# Patient Record
Sex: Female | Born: 1980 | Race: White | Hispanic: No | Marital: Single | Smoking: Former smoker
Health system: Southern US, Community
[De-identification: ages and names within clinical notes are randomized; demographics above are authoritative.]

## PROBLEM LIST (undated history)

## (undated) DIAGNOSIS — R002 Palpitations: Secondary | ICD-10-CM

## (undated) DIAGNOSIS — I10 Essential (primary) hypertension: Secondary | ICD-10-CM

## (undated) HISTORY — DX: Palpitations: R00.2

## (undated) HISTORY — DX: Essential (primary) hypertension: I10

## (undated) HISTORY — PX: ABDOMINAL HYSTERECTOMY: SHX81

---

## 2004-12-09 ENCOUNTER — Emergency Department: Payer: Self-pay | Admitting: General Practice

## 2005-09-07 ENCOUNTER — Emergency Department: Payer: Self-pay | Admitting: Emergency Medicine

## 2006-05-05 ENCOUNTER — Emergency Department: Payer: Self-pay | Admitting: Emergency Medicine

## 2007-07-04 ENCOUNTER — Emergency Department: Payer: Self-pay | Admitting: Internal Medicine

## 2008-12-19 ENCOUNTER — Emergency Department: Payer: Self-pay | Admitting: Emergency Medicine

## 2009-07-28 ENCOUNTER — Emergency Department: Payer: Self-pay | Admitting: Emergency Medicine

## 2010-04-22 ENCOUNTER — Ambulatory Visit: Payer: Self-pay | Admitting: Unknown Physician Specialty

## 2010-05-09 ENCOUNTER — Emergency Department: Payer: Self-pay

## 2012-05-04 ENCOUNTER — Emergency Department: Payer: Self-pay | Admitting: Unknown Physician Specialty

## 2012-05-04 LAB — COMPREHENSIVE METABOLIC PANEL
Albumin: 3.7 g/dL (ref 3.4–5.0)
Co2: 30 mmol/L (ref 21–32)
Creatinine: 0.75 mg/dL (ref 0.60–1.30)
EGFR (African American): >60
EGFR (Non-African Amer.): >60
Osmolality: 277 (ref 275–301)
Potassium: 4.1 mmol/L (ref 3.5–5.1)
SGPT (ALT): 26 U/L
Sodium: 139 mmol/L (ref 136–145)

## 2012-05-04 LAB — URINALYSIS, COMPLETE
Glucose,UR: NEGATIVE mg/dL (ref 0–75)
Leukocyte Esterase: NEGATIVE
Nitrite: NEGATIVE
Protein: NEGATIVE
RBC,UR: <1 /HPF (ref 0–5)
Specific Gravity: 1.013 (ref 1.003–1.030)
WBC UR: 1 /HPF (ref 0–5)

## 2012-05-04 LAB — CK TOTAL AND CKMB (NOT AT ARMC)
CK, Total: 92 U/L (ref 21–215)
CK-MB: <0.5 ng/mL — ABNORMAL LOW (ref 0.5–3.6)

## 2012-05-04 LAB — CBC
HCT: 43.5 % (ref 35.0–47.0)
MCHC: 33.8 g/dL (ref 32.0–36.0)
Platelet: 340 10*3/uL (ref 150–440)
RBC: 5.09 10*6/uL (ref 3.80–5.20)
WBC: 11.3 10*3/uL — ABNORMAL HIGH (ref 3.6–11.0)

## 2013-09-06 ENCOUNTER — Emergency Department: Payer: Self-pay | Admitting: Emergency Medicine

## 2016-01-16 ENCOUNTER — Emergency Department: Payer: BLUE CROSS/BLUE SHIELD

## 2016-01-16 ENCOUNTER — Emergency Department
Admission: EM | Admit: 2016-01-16 | Discharge: 2016-01-16 | Disposition: A | Discharge status: Home / Self Care | Payer: BLUE CROSS/BLUE SHIELD | Attending: Emergency Medicine | Admitting: Emergency Medicine

## 2016-01-16 ENCOUNTER — Encounter: Payer: Self-pay | Admitting: Emergency Medicine

## 2016-01-16 DIAGNOSIS — R002 Palpitations: Secondary | ICD-10-CM | POA: Diagnosis not present

## 2016-01-16 DIAGNOSIS — I10 Essential (primary) hypertension: Secondary | ICD-10-CM | POA: Diagnosis present

## 2016-01-16 DIAGNOSIS — F17200 Nicotine dependence, unspecified, uncomplicated: Secondary | ICD-10-CM | POA: Diagnosis not present

## 2016-01-16 LAB — TROPONIN I: Troponin I: <0.03 ng/mL (ref <0.031)

## 2016-01-16 LAB — CBC
HEMATOCRIT: 41.6 % (ref 35.0–47.0)
Hemoglobin: 14.3 g/dL (ref 12.0–16.0)
MCH: 29.1 pg (ref 26.0–34.0)
MCHC: 34.4 g/dL (ref 32.0–36.0)
MCV: 84.7 fL (ref 80.0–100.0)
Platelets: 311 10*3/uL (ref 150–440)
RBC: 4.91 MIL/uL (ref 3.80–5.20)
RDW: 14.3 % (ref 11.5–14.5)
WBC: 10 10*3/uL (ref 3.6–11.0)

## 2016-01-16 LAB — BASIC METABOLIC PANEL
Anion gap: 8 (ref 5–15)
BUN: 14 mg/dL (ref 6–20)
CHLORIDE: 104 mmol/L (ref 101–111)
CO2: 25 mmol/L (ref 22–32)
CREATININE: 0.76 mg/dL (ref 0.44–1.00)
Calcium: 9.7 mg/dL (ref 8.9–10.3)
GFR calc Af Amer: >60 mL/min (ref >60)
GFR calc non Af Amer: >60 mL/min (ref >60)
GLUCOSE: 91 mg/dL (ref 65–99)
POTASSIUM: 4.1 mmol/L (ref 3.5–5.1)
SODIUM: 137 mmol/L (ref 135–145)

## 2016-01-16 LAB — TSH: TSH: 1.22 u[IU]/mL (ref 0.350–4.500)

## 2016-01-16 MED ORDER — HYDROCHLOROTHIAZIDE 25 MG PO TABS: 25.0000 mg | ORAL_TABLET | Freq: Every day | ORAL | Status: DC

## 2016-01-16 NOTE — ED Provider Notes (Signed)
[facility] Emergency Department Provider Note     Time seen: ----------------------------------------- 1:31 PM on 01/16/2016 -----------------------------------------    I have reviewed the triage vital signs and the nursing notes.   HISTORY  Chief Complaint Tachycardia    HPI Yvonne Davis is a 35 y.o. female who presents ER stating her heart is fluttering for a long time. Patient states today it is worse as making her feel bad, states she doesn't have a doctor so she presents here for further evaluation. Patient states that she has been off her blood pressure medicines for about a year because she couldn't afford it and she has no insurance. She denies fevers, chills, chest pain, shortness of breath, nausea vomiting or diarrhea. She does have a history of hypertension.She has noted that caffeine worsens or symptoms.   History reviewed. No pertinent past medical history.  There are no active problems to display for this patient.   No past surgical history on file.  Allergies Review of patient's allergies indicates no known allergies.  Social History Social History  Substance Use Topics  . Smoking status: Current Every Day Smoker  . Smokeless tobacco: None  . Alcohol Use: No    Review of Systems Constitutional: Negative for fever. Eyes: Negative for visual changes. ENT: Negative for sore throat. Cardiovascular: Negative for chest pain. Positive for palpitations Respiratory: Negative for shortness of breath. Gastrointestinal: Negative for abdominal pain, vomiting and diarrhea. Genitourinary: Negative for dysuria. Musculoskeletal: Negative for back pain. Skin: Negative for rash. Neurological: Negative for headaches, focal weakness or numbness.  10-point ROS otherwise negative.  ____________________________________________   PHYSICAL EXAM:  VITAL SIGNS: ED Triage Vitals  Enc Vitals Group     BP 01/16/16 1255 171/98 mmHg     Pulse  Rate 01/16/16 1255 78     Resp 01/16/16 1255 16     Temp 01/16/16 1255 98.2 F (36.8 C)     Temp Source 01/16/16 1255 Oral     SpO2 01/16/16 1255 96 %     Weight 01/16/16 1255 200 lb (90.719 kg)     Height 01/16/16 1255  (1.702 m)     Head Cir --      Peak Flow --      Pain Score --      Pain Loc --      Pain Edu? --      Excl. in [place]? --     Constitutional: Alert and oriented. Well appearing and in no distress. Eyes: Conjunctivae are normal. PERRL. Normal extraocular movements. ENT   Head: Normocephalic and atraumatic.   Nose: No congestion/rhinnorhea.   Mouth/Throat: Mucous membranes are moist.   Neck: No stridor. Cardiovascular: Normal rate, regular rhythm. Normal and symmetric distal pulses are present in all extremities. No murmurs, rubs, or gallops. Respiratory: Normal respiratory effort without tachypnea nor retractions. Breath sounds are clear and equal bilaterally. No wheezes/rales/rhonchi. Gastrointestinal: Soft and nontender. No distention. No abdominal bruits.  Musculoskeletal: Nontender with normal range of motion in all extremities. No joint effusions.  No lower extremity tenderness nor edema. Neurologic:  Normal speech and language. No gross focal neurologic deficits are appreciated. Speech is normal. No gait instability. Skin:  Skin is warm, dry and intact. No rash noted. Psychiatric: Mood and affect are normal. Speech and behavior are normal. Patient exhibits appropriate insight and judgment. ____________________________________________  EKG: Interpreted by me. Normal sinus rhythm with a rate of 79 bpm, normal PR interval, normal QRS, normal QT interval, PVCs, normal  axis.  ____________________________________________  ED COURSE:  Pertinent labs & imaging results that were available during my care of the patient were reviewed by me and considered in my medical decision making (see chart for details). Patient is in no acute distress but does have  significant hypertension, I will check basic labs and reevaluate. ____________________________________________    LABS (pertinent positives/negatives)  Labs Reviewed  BASIC METABOLIC PANEL  CBC  TROPONIN I  TSH   ____________________________________________  FINAL ASSESSMENT AND PLAN  Palpitations, hypertension  Plan: Patient with labs and imaging as dictated above. Patient be restarted on HCTZ for her blood pressure. Palpitations are likely multifactorial, will advise stopping all caffeine intake. She stable for outpatient follow-up.   Emily Filbert, MD   Emily Filbert, MD 01/16/16 [phone]

## 2016-01-16 NOTE — ED Notes (Signed)
Lab called about add on TSH. States they will run it.

## 2016-01-16 NOTE — ED Notes (Signed)
Says her heart has been fluttering for a long time, but today it is worse and it is making her feel bad.  Says she does not have a doctor.

## 2016-01-16 NOTE — Discharge Instructions (Signed)
Hypertension °Hypertension, commonly called high blood pressure, is when the force of blood pumping through your arteries is too strong. Your arteries are the blood vessels that carry blood from your heart throughout your body. A blood pressure reading consists of a higher number over a lower number, such as 110/72. The higher number (systolic) is the pressure inside your arteries when your heart pumps. The lower number (diastolic) is the pressure inside your arteries when your heart relaxes. Ideally you want your blood pressure below 120/80. °Hypertension forces your heart to work harder to pump blood. Your arteries may become narrow or stiff. Having untreated or uncontrolled hypertension can cause heart attack, stroke, kidney disease, and other problems. °RISK FACTORS °Some risk factors for high blood pressure are controllable. Others are not.  °Risk factors you cannot control include:  °· Race. You may be at higher risk if you are African American. °· Age. Risk increases with age. °· Gender. Men are at higher risk than women before age 45 years. After age 65, women are at higher risk than men. °Risk factors you can control include: °· Not getting enough exercise or physical activity. °· Being overweight. °· Getting too much fat, sugar, calories, or salt in your diet. °· Drinking too much alcohol. °SIGNS AND SYMPTOMS °Hypertension does not usually cause signs or symptoms. Extremely high blood pressure (hypertensive crisis) may cause headache, anxiety, shortness of breath, and nosebleed. °DIAGNOSIS °To check if you have hypertension, your health care provider will measure your blood pressure while you are seated, with your arm held at the level of your heart. It should be measured at least twice using the same arm. Certain conditions can cause a difference in blood pressure between your right and left arms. A blood pressure reading that is higher than normal on one occasion does not mean that you need treatment. If  it is not clear whether you have high blood pressure, you may be asked to return on a different day to have your blood pressure checked again. Or, you may be asked to monitor your blood pressure at home for 1 or more weeks. °TREATMENT °Treating high blood pressure includes making lifestyle changes and possibly taking medicine. Living a healthy lifestyle can help lower high blood pressure. You may need to change some of your habits. °Lifestyle changes may include: °· Following the DASH diet. This diet is high in fruits, vegetables, and whole grains. It is low in salt, red meat, and added sugars. °· Keep your sodium intake below 2,300 mg per day. °· Getting at least 30-45 minutes of aerobic exercise at least 4 times per week. °· Losing weight if necessary. °· Not smoking. °· Limiting alcoholic beverages. °· Learning ways to reduce stress. °Your health care provider may prescribe medicine if lifestyle changes are not enough to get your blood pressure under control, and if one of the following is true: °· You are 18-59 years of age and your systolic blood pressure is above 140. °· You are 60 years of age or older, and your systolic blood pressure is above 150. °· Your diastolic blood pressure is above 90. °· You have diabetes, and your systolic blood pressure is over 140 or your diastolic blood pressure is over 90. °· You have kidney disease and your blood pressure is above 140/90. °· You have heart disease and your blood pressure is above 140/90. °Your personal target blood pressure may vary depending on your medical conditions, your age, and other factors. °HOME CARE INSTRUCTIONS °·   Have your blood pressure rechecked as directed by your health care provider.   °· Take medicines only as directed by your health care provider. Follow the directions carefully. Blood pressure medicines must be taken as prescribed. The medicine does not work as well when you skip doses. Skipping doses also puts you at risk for  problems. °· Do not smoke.   °· Monitor your blood pressure at home as directed by your health care provider.  °SEEK MEDICAL CARE IF:  °· You think you are having a reaction to medicines taken. °· You have recurrent headaches or feel dizzy. °· You have swelling in your ankles. °· You have trouble with your vision. °SEEK IMMEDIATE MEDICAL CARE IF: °· You develop a severe headache or confusion. °· You have unusual weakness, numbness, or feel faint. °· You have severe chest or abdominal pain. °· You vomit repeatedly. °· You have trouble breathing. °MAKE SURE YOU:  °· Understand these instructions. °· Will watch your condition. °· Will get help right away if you are not doing well or get worse. °  °This information is not intended to replace advice given to you by your health care provider. Make sure you discuss any questions you have with your health care provider. °  °Document Released: 12/07/2005 Document Revised: 04/23/2015 Document Reviewed: 09/29/2013 °Elsevier Interactive Patient Education ©2016 [employer] ° °Palpitations °A palpitation is the feeling that your heartbeat is irregular or is faster than normal. It may feel like your heart is fluttering or skipping a beat. Palpitations are usually not a serious problem. However, in some cases, you may need further medical evaluation. °CAUSES  °Palpitations can be caused by: °· Smoking. °· Caffeine or other stimulants, such as diet pills or energy drinks. °· Alcohol. °· Stress and anxiety. °· Strenuous physical activity. °· Fatigue. °· Certain medicines. °· Heart disease, especially if you have a history of irregular heart rhythms (arrhythmias), such as atrial fibrillation, atrial flutter, or supraventricular tachycardia. °· An improperly working pacemaker or defibrillator. °DIAGNOSIS  °To find the cause of your palpitations, your health care provider will take your medical history and perform a physical exam. Your health care provider may also have you take a  test called an ambulatory electrocardiogram (ECG). An ECG records your heartbeat patterns over a 24-hour period. You may also have other tests, such as: °· Transthoracic echocardiogram (TTE). During echocardiography, sound waves are used to evaluate how blood flows through your heart. °· Transesophageal echocardiogram (TEE). °· Cardiac monitoring. This allows your health care provider to monitor your heart rate and rhythm in real time. °· Holter monitor. This is a portable device that records your heartbeat and can help diagnose heart arrhythmias. It allows your health care provider to track your heart activity for several days, if needed. °· Stress tests by exercise or by giving medicine that makes the heart beat faster. °TREATMENT  °Treatment of palpitations depends on the cause of your symptoms and can vary greatly. Most cases of palpitations do not require any treatment other than time, relaxation, and monitoring your symptoms. Other causes, such as atrial fibrillation, atrial flutter, or supraventricular tachycardia, usually require further treatment. °HOME CARE INSTRUCTIONS  °· Avoid: °¨ Caffeinated coffee, tea, soft drinks, diet pills, and energy drinks. °¨ Chocolate. °¨ Alcohol. °· Stop smoking if you smoke. °· Reduce your stress and anxiety. Things that can help you relax include: °¨ A method of controlling things in your body, such as your heartbeats, with your mind (biofeedback). °¨ Yoga. °¨ Meditation. °¨   Physical activity such as swimming, jogging, or walking. °· Get plenty of rest and sleep. °SEEK MEDICAL CARE IF:  °· You continue to have a fast or irregular heartbeat beyond 24 hours. °· Your palpitations occur more often. °SEEK IMMEDIATE MEDICAL CARE IF: °· You have chest pain or shortness of breath. °· You have a severe headache. °· You feel dizzy or you faint. °MAKE SURE YOU: °· Understand these instructions. °· Will watch your condition. °· Will get help right away if you are not doing well or get  worse. °  °This information is not intended to replace advice given to you by your health care provider. Make sure you discuss any questions you have with your health care provider. °  °Document Released: 12/04/2000 Document Revised: 12/12/2013 Document Reviewed: 02/05/2012 °Elsevier Interactive Patient Education ©2016 Elsevier Inc. ° °

## 2016-06-16 DIAGNOSIS — R002 Palpitations: Secondary | ICD-10-CM | POA: Diagnosis not present

## 2016-06-16 DIAGNOSIS — I10 Essential (primary) hypertension: Secondary | ICD-10-CM | POA: Insufficient documentation

## 2016-06-16 DIAGNOSIS — F17200 Nicotine dependence, unspecified, uncomplicated: Secondary | ICD-10-CM | POA: Diagnosis not present

## 2016-06-16 DIAGNOSIS — E669 Obesity, unspecified: Secondary | ICD-10-CM | POA: Diagnosis not present

## 2016-06-22 DIAGNOSIS — R002 Palpitations: Secondary | ICD-10-CM | POA: Diagnosis not present

## 2017-02-12 DIAGNOSIS — H5213 Myopia, bilateral: Secondary | ICD-10-CM | POA: Diagnosis not present

## 2017-05-07 DIAGNOSIS — I824Z9 Acute embolism and thrombosis of unspecified deep veins of unspecified distal lower extremity: Secondary | ICD-10-CM | POA: Diagnosis not present

## 2017-05-07 DIAGNOSIS — G629 Polyneuropathy, unspecified: Secondary | ICD-10-CM | POA: Diagnosis not present

## 2017-05-11 ENCOUNTER — Other Ambulatory Visit: Payer: Self-pay | Admitting: Internal Medicine

## 2017-05-11 DIAGNOSIS — I824Z3 Acute embolism and thrombosis of unspecified deep veins of distal lower extremity, bilateral: Secondary | ICD-10-CM

## 2017-05-11 DIAGNOSIS — E784 Other hyperlipidemia: Secondary | ICD-10-CM | POA: Diagnosis not present

## 2017-05-11 DIAGNOSIS — I10 Essential (primary) hypertension: Secondary | ICD-10-CM | POA: Diagnosis not present

## 2017-05-11 DIAGNOSIS — E119 Type 2 diabetes mellitus without complications: Secondary | ICD-10-CM | POA: Diagnosis not present

## 2017-05-11 DIAGNOSIS — Z1231 Encounter for screening mammogram for malignant neoplasm of breast: Secondary | ICD-10-CM

## 2017-05-11 DIAGNOSIS — R5381 Other malaise: Secondary | ICD-10-CM | POA: Diagnosis not present

## 2017-05-13 ENCOUNTER — Ambulatory Visit
Admission: RE | Admit: 2017-05-13 | Discharge: 2017-05-13 | Disposition: A | Discharge status: Home / Self Care | Payer: BLUE CROSS/BLUE SHIELD | Source: Ambulatory Visit | Attending: Internal Medicine | Admitting: Internal Medicine

## 2017-05-13 DIAGNOSIS — M7989 Other specified soft tissue disorders: Secondary | ICD-10-CM | POA: Diagnosis not present

## 2017-05-13 DIAGNOSIS — M79605 Pain in left leg: Secondary | ICD-10-CM | POA: Diagnosis not present

## 2017-05-13 DIAGNOSIS — I824Z3 Acute embolism and thrombosis of unspecified deep veins of distal lower extremity, bilateral: Secondary | ICD-10-CM

## 2017-05-13 DIAGNOSIS — M79604 Pain in right leg: Secondary | ICD-10-CM | POA: Insufficient documentation

## 2017-05-14 DIAGNOSIS — I10 Essential (primary) hypertension: Secondary | ICD-10-CM | POA: Insufficient documentation

## 2017-05-24 DIAGNOSIS — G629 Polyneuropathy, unspecified: Secondary | ICD-10-CM | POA: Diagnosis not present

## 2017-05-24 DIAGNOSIS — I824Z9 Acute embolism and thrombosis of unspecified deep veins of unspecified distal lower extremity: Secondary | ICD-10-CM | POA: Diagnosis not present

## 2017-05-26 ENCOUNTER — Ambulatory Visit: Payer: BLUE CROSS/BLUE SHIELD | Attending: Internal Medicine

## 2017-05-31 ENCOUNTER — Ambulatory Visit: Payer: Self-pay | Admitting: Family Medicine

## 2017-07-09 DIAGNOSIS — E669 Obesity, unspecified: Secondary | ICD-10-CM | POA: Diagnosis not present

## 2017-07-09 DIAGNOSIS — I10 Essential (primary) hypertension: Secondary | ICD-10-CM | POA: Diagnosis not present

## 2017-08-11 DIAGNOSIS — H16042 Marginal corneal ulcer, left eye: Secondary | ICD-10-CM | POA: Diagnosis not present

## 2017-08-13 DIAGNOSIS — H16042 Marginal corneal ulcer, left eye: Secondary | ICD-10-CM | POA: Diagnosis not present

## 2017-08-16 DIAGNOSIS — I824Z9 Acute embolism and thrombosis of unspecified deep veins of unspecified distal lower extremity: Secondary | ICD-10-CM | POA: Diagnosis not present

## 2017-08-16 DIAGNOSIS — E669 Obesity, unspecified: Secondary | ICD-10-CM | POA: Diagnosis not present

## 2017-08-16 DIAGNOSIS — G629 Polyneuropathy, unspecified: Secondary | ICD-10-CM | POA: Diagnosis not present

## 2017-08-16 DIAGNOSIS — I10 Essential (primary) hypertension: Secondary | ICD-10-CM | POA: Diagnosis not present

## 2017-09-19 IMAGING — US US EXTREM LOW VENOUS BILAT
1 series · 13 of 24 positions shown · non-contrast
Comparison: None.

CLINICAL DATA: Acute onset of bilateral lower extremity pain.
Initial encounter.



[Series 1: us extrem low venous bilat · 0.09mm/px · 13 of 64 slices shown]
[im 1/64]
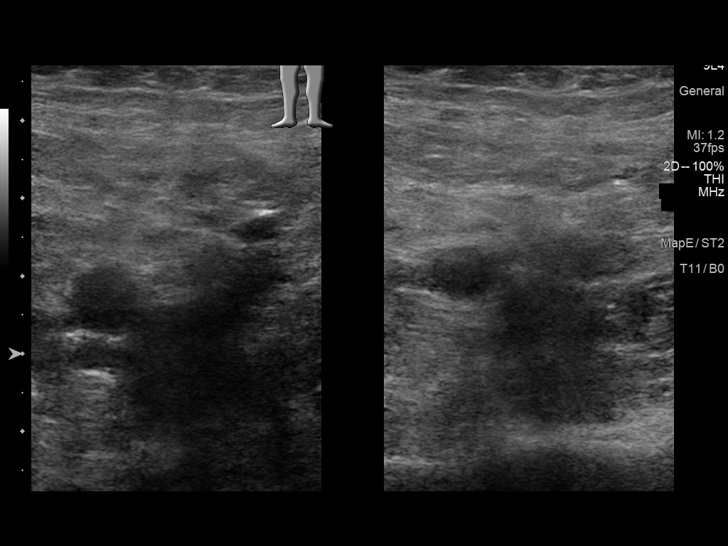
[im 6/64]
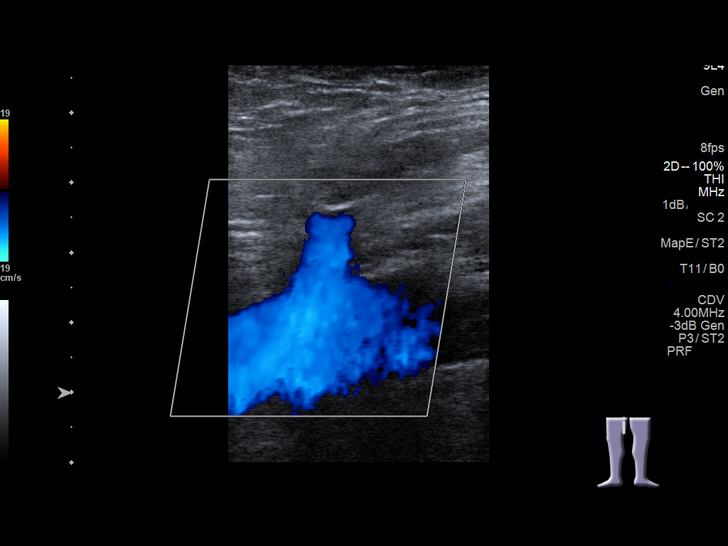
[im 11/64]
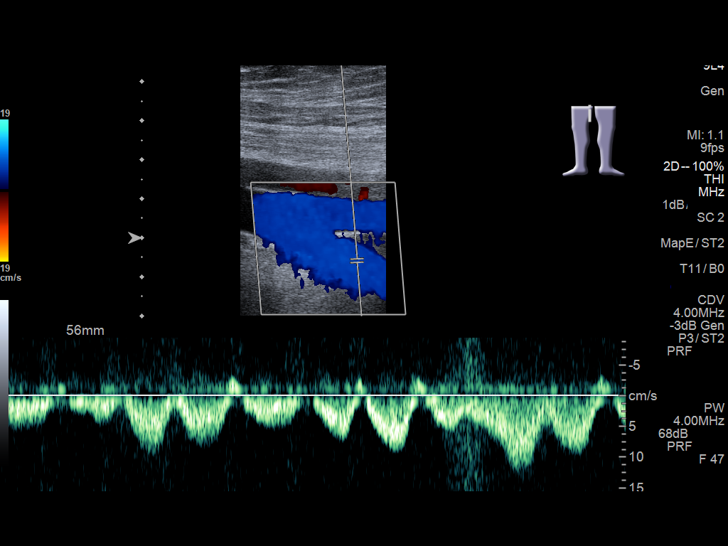
[im 17/64]
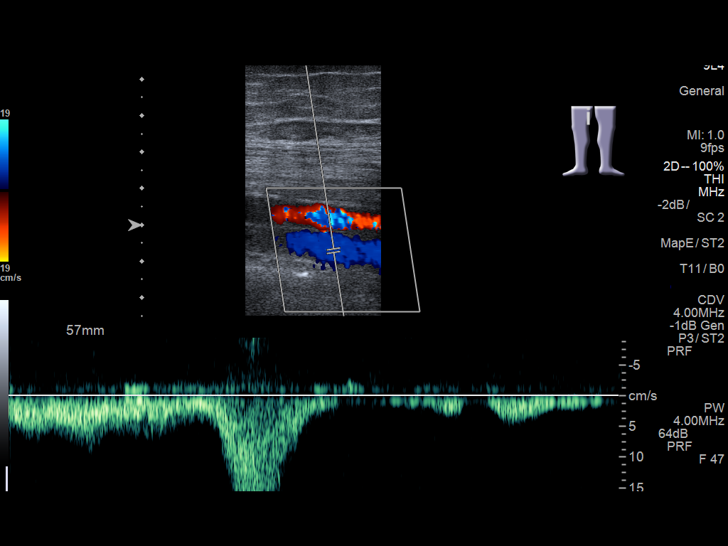
[im 22/64]
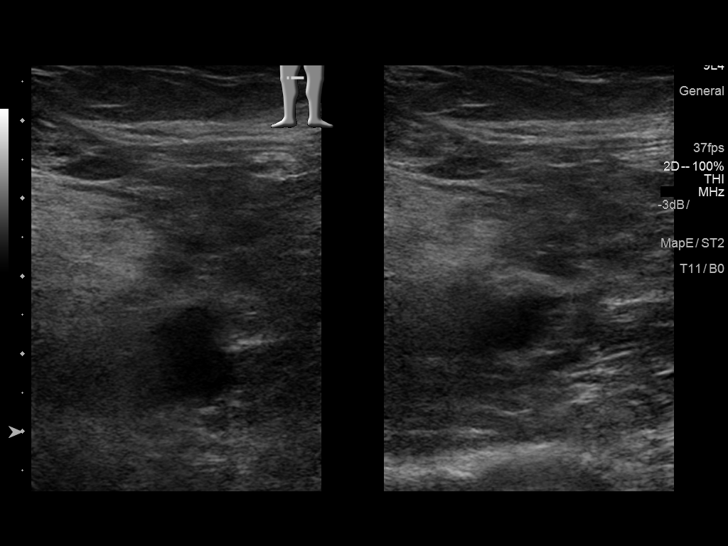
[im 28/64]
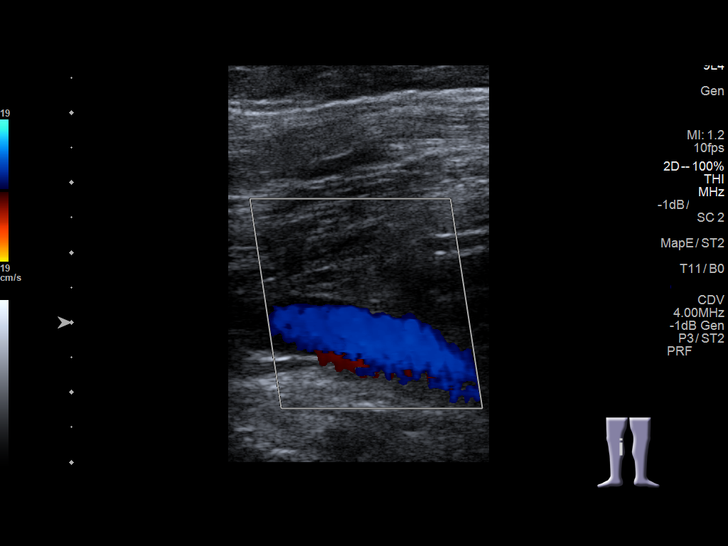
[im 33/64]
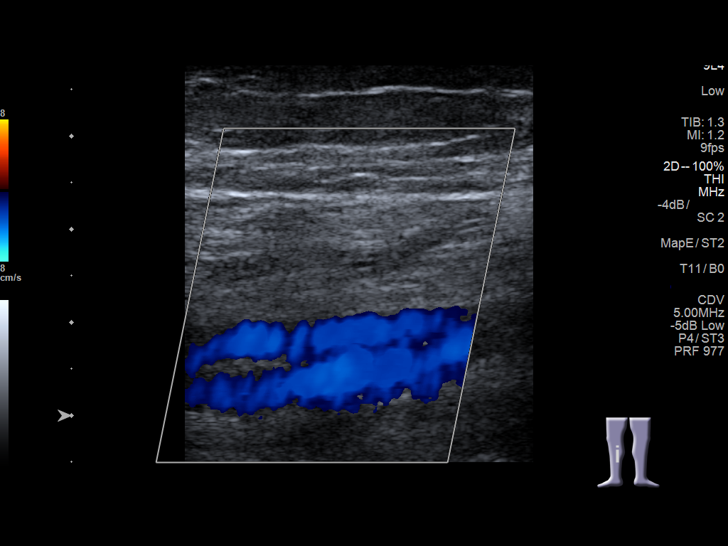
[im 36/64]
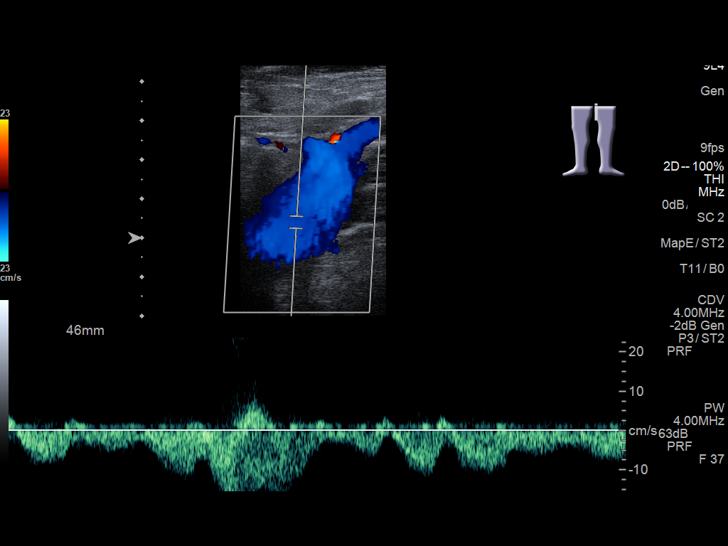
[im 42/64]
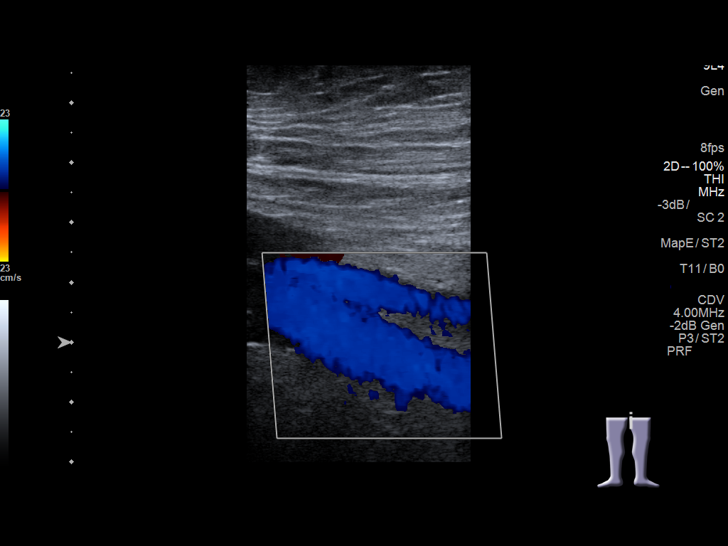
[im 47/64]
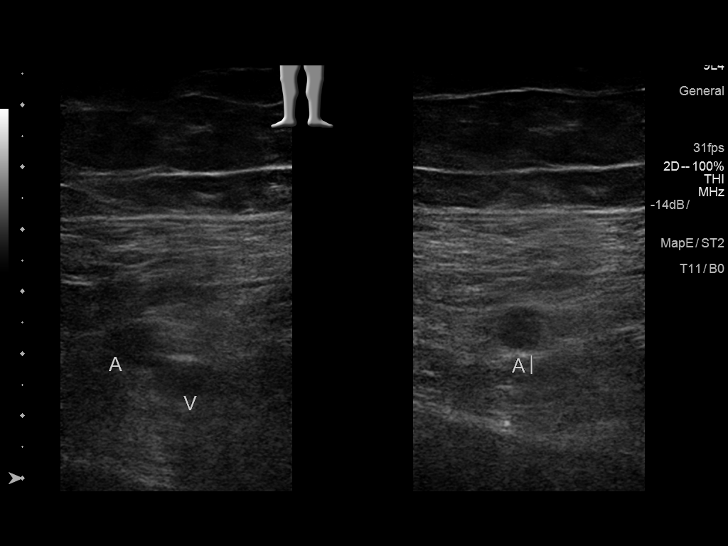
[im 53/64]
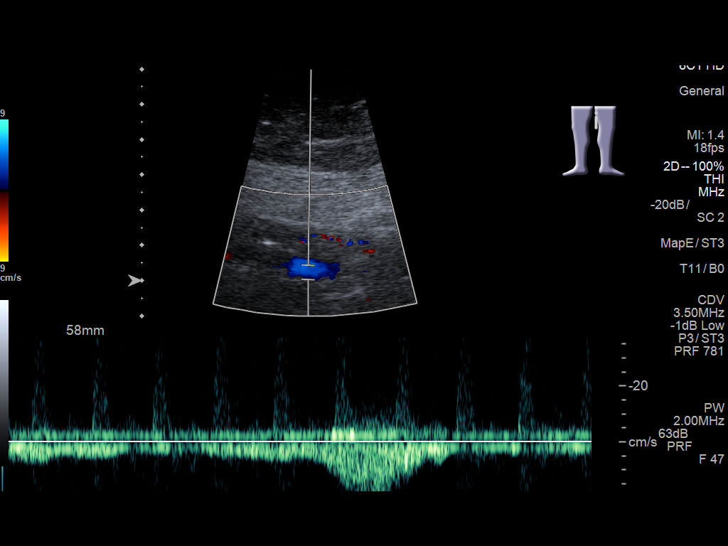
[im 58/64]
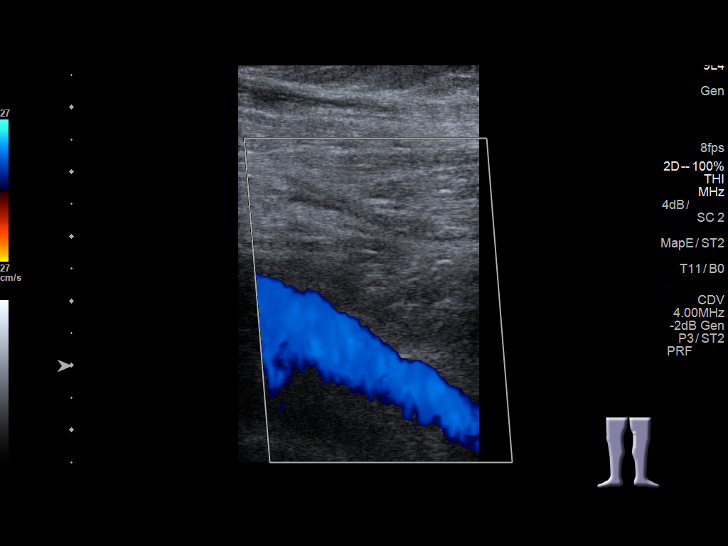
[im 64/64]
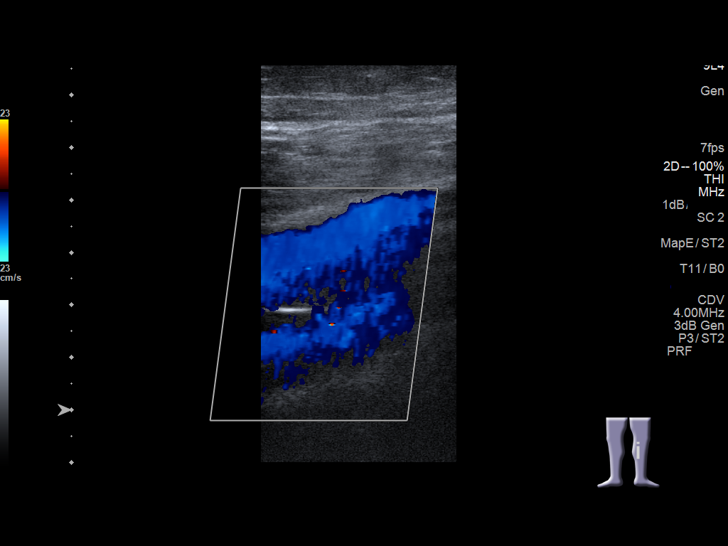

[13 of 24 positions shown; findings below may reference images not displayed]

FINDINGS: RIGHT LOWER EXTREMITY

Common Femoral Vein: No evidence of thrombus. Normal
compressibility, respiratory phasicity and response to augmentation.

Saphenofemoral Junction: No evidence of thrombus. Normal
compressibility and flow on color Doppler imaging.

Profunda Femoral Vein: No evidence of thrombus. Normal
compressibility and flow on color Doppler imaging.

Femoral Vein: No evidence of thrombus. Normal compressibility,
respiratory phasicity and response to augmentation.

Popliteal Vein: No evidence of thrombus. Normal compressibility,
respiratory phasicity and response to augmentation.

Calf Veins: No evidence of thrombus. Normal compressibility and flow
on color Doppler imaging.

Superficial Great Saphenous Vein: No evidence of thrombus. Normal
compressibility and flow on color Doppler imaging.

Venous Reflux:  None.

Other Findings:  None.

LEFT LOWER EXTREMITY

Common Femoral Vein: No evidence of thrombus. Normal
compressibility, respiratory phasicity and response to augmentation.

Saphenofemoral Junction: No evidence of thrombus. Normal
compressibility and flow on color Doppler imaging.

Profunda Femoral Vein: No evidence of thrombus. Normal
compressibility and flow on color Doppler imaging.

Femoral Vein: No evidence of thrombus. Normal compressibility,
respiratory phasicity and response to augmentation.

Popliteal Vein: No evidence of thrombus. Normal compressibility,
respiratory phasicity and response to augmentation.

Calf Veins: No evidence of thrombus. Normal compressibility and flow
on color Doppler imaging.

Superficial Great Saphenous Vein: No evidence of thrombus. Normal
compressibility and flow on color Doppler imaging.

Venous Reflux:  None.

Other Findings:  None.
IMPRESSION: No evidence of DVT within either lower extremity. Evaluation is
mildly suboptimal due to the patient's habitus.

## 2018-03-11 DIAGNOSIS — E669 Obesity, unspecified: Secondary | ICD-10-CM | POA: Diagnosis not present

## 2018-03-11 DIAGNOSIS — M1990 Unspecified osteoarthritis, unspecified site: Secondary | ICD-10-CM | POA: Diagnosis not present

## 2018-03-11 DIAGNOSIS — L309 Dermatitis, unspecified: Secondary | ICD-10-CM | POA: Diagnosis not present

## 2018-03-11 DIAGNOSIS — H5213 Myopia, bilateral: Secondary | ICD-10-CM | POA: Diagnosis not present

## 2018-03-11 DIAGNOSIS — I10 Essential (primary) hypertension: Secondary | ICD-10-CM | POA: Diagnosis not present

## 2018-04-12 DIAGNOSIS — L309 Dermatitis, unspecified: Secondary | ICD-10-CM | POA: Diagnosis not present

## 2018-04-12 DIAGNOSIS — M1990 Unspecified osteoarthritis, unspecified site: Secondary | ICD-10-CM | POA: Diagnosis not present

## 2018-04-12 DIAGNOSIS — E669 Obesity, unspecified: Secondary | ICD-10-CM | POA: Diagnosis not present

## 2018-04-12 DIAGNOSIS — I10 Essential (primary) hypertension: Secondary | ICD-10-CM | POA: Diagnosis not present

## 2018-06-13 DIAGNOSIS — E669 Obesity, unspecified: Secondary | ICD-10-CM | POA: Diagnosis not present

## 2018-06-13 DIAGNOSIS — M1990 Unspecified osteoarthritis, unspecified site: Secondary | ICD-10-CM | POA: Diagnosis not present

## 2018-06-13 DIAGNOSIS — L309 Dermatitis, unspecified: Secondary | ICD-10-CM | POA: Diagnosis not present

## 2018-06-13 DIAGNOSIS — I10 Essential (primary) hypertension: Secondary | ICD-10-CM | POA: Diagnosis not present

## 2018-08-08 DIAGNOSIS — M1990 Unspecified osteoarthritis, unspecified site: Secondary | ICD-10-CM | POA: Diagnosis not present

## 2018-08-08 DIAGNOSIS — I10 Essential (primary) hypertension: Secondary | ICD-10-CM | POA: Diagnosis not present

## 2018-08-08 DIAGNOSIS — E669 Obesity, unspecified: Secondary | ICD-10-CM | POA: Diagnosis not present

## 2018-08-08 DIAGNOSIS — L309 Dermatitis, unspecified: Secondary | ICD-10-CM | POA: Diagnosis not present

## 2018-10-07 DIAGNOSIS — I10 Essential (primary) hypertension: Secondary | ICD-10-CM | POA: Diagnosis not present

## 2018-10-07 DIAGNOSIS — E669 Obesity, unspecified: Secondary | ICD-10-CM | POA: Diagnosis not present

## 2018-10-07 DIAGNOSIS — L309 Dermatitis, unspecified: Secondary | ICD-10-CM | POA: Diagnosis not present

## 2018-10-07 DIAGNOSIS — M1990 Unspecified osteoarthritis, unspecified site: Secondary | ICD-10-CM | POA: Diagnosis not present

## 2018-12-22 DIAGNOSIS — Z720 Tobacco use: Secondary | ICD-10-CM | POA: Diagnosis not present

## 2018-12-22 DIAGNOSIS — E669 Obesity, unspecified: Secondary | ICD-10-CM | POA: Diagnosis not present

## 2018-12-22 DIAGNOSIS — I10 Essential (primary) hypertension: Secondary | ICD-10-CM | POA: Diagnosis not present

## 2018-12-22 DIAGNOSIS — M1990 Unspecified osteoarthritis, unspecified site: Secondary | ICD-10-CM | POA: Diagnosis not present

## 2019-01-23 ENCOUNTER — Other Ambulatory Visit: Payer: Self-pay | Admitting: Internal Medicine

## 2019-01-23 DIAGNOSIS — E669 Obesity, unspecified: Secondary | ICD-10-CM | POA: Diagnosis not present

## 2019-01-23 DIAGNOSIS — L309 Dermatitis, unspecified: Secondary | ICD-10-CM | POA: Diagnosis not present

## 2019-01-23 DIAGNOSIS — I10 Essential (primary) hypertension: Secondary | ICD-10-CM | POA: Diagnosis not present

## 2019-01-23 DIAGNOSIS — Z1231 Encounter for screening mammogram for malignant neoplasm of breast: Secondary | ICD-10-CM

## 2019-01-23 DIAGNOSIS — M1990 Unspecified osteoarthritis, unspecified site: Secondary | ICD-10-CM | POA: Diagnosis not present

## 2019-02-06 ENCOUNTER — Ambulatory Visit
Admission: RE | Admit: 2019-02-06 | Discharge: 2019-02-06 | Disposition: A | Discharge status: Home / Self Care | Payer: BLUE CROSS/BLUE SHIELD | Source: Ambulatory Visit | Attending: Internal Medicine | Admitting: Internal Medicine

## 2019-02-06 DIAGNOSIS — Z1231 Encounter for screening mammogram for malignant neoplasm of breast: Secondary | ICD-10-CM | POA: Insufficient documentation

## 2019-02-16 ENCOUNTER — Other Ambulatory Visit: Payer: Self-pay | Admitting: Internal Medicine

## 2019-02-16 DIAGNOSIS — N6320 Unspecified lump in the left breast, unspecified quadrant: Secondary | ICD-10-CM

## 2019-02-16 DIAGNOSIS — N6310 Unspecified lump in the right breast, unspecified quadrant: Secondary | ICD-10-CM

## 2019-02-16 DIAGNOSIS — R928 Other abnormal and inconclusive findings on diagnostic imaging of breast: Secondary | ICD-10-CM

## 2019-02-21 ENCOUNTER — Ambulatory Visit
Admission: RE | Admit: 2019-02-21 | Discharge: 2019-02-21 | Disposition: A | Discharge status: Home / Self Care | Payer: BLUE CROSS/BLUE SHIELD | Source: Ambulatory Visit | Attending: Internal Medicine | Admitting: Internal Medicine

## 2019-02-21 ENCOUNTER — Other Ambulatory Visit: Payer: Self-pay | Admitting: Internal Medicine

## 2019-02-21 DIAGNOSIS — N6320 Unspecified lump in the left breast, unspecified quadrant: Secondary | ICD-10-CM

## 2019-02-21 DIAGNOSIS — R928 Other abnormal and inconclusive findings on diagnostic imaging of breast: Secondary | ICD-10-CM

## 2019-02-21 DIAGNOSIS — N6489 Other specified disorders of breast: Secondary | ICD-10-CM | POA: Diagnosis not present

## 2019-02-21 DIAGNOSIS — N6310 Unspecified lump in the right breast, unspecified quadrant: Secondary | ICD-10-CM

## 2019-02-27 ENCOUNTER — Other Ambulatory Visit: Payer: Self-pay | Admitting: Internal Medicine

## 2019-02-27 DIAGNOSIS — R928 Other abnormal and inconclusive findings on diagnostic imaging of breast: Secondary | ICD-10-CM

## 2019-03-01 ENCOUNTER — Ambulatory Visit: Payer: BLUE CROSS/BLUE SHIELD

## 2019-03-01 DIAGNOSIS — J0140 Acute pansinusitis, unspecified: Secondary | ICD-10-CM | POA: Diagnosis not present

## 2019-03-01 DIAGNOSIS — J209 Acute bronchitis, unspecified: Secondary | ICD-10-CM | POA: Diagnosis not present

## 2019-03-06 DIAGNOSIS — I10 Essential (primary) hypertension: Secondary | ICD-10-CM | POA: Diagnosis not present

## 2019-03-06 DIAGNOSIS — L309 Dermatitis, unspecified: Secondary | ICD-10-CM | POA: Diagnosis not present

## 2019-03-06 DIAGNOSIS — E669 Obesity, unspecified: Secondary | ICD-10-CM | POA: Diagnosis not present

## 2019-03-06 DIAGNOSIS — J019 Acute sinusitis, unspecified: Secondary | ICD-10-CM | POA: Diagnosis not present

## 2019-03-07 ENCOUNTER — Ambulatory Visit
Admission: RE | Admit: 2019-03-07 | Discharge: 2019-03-07 | Disposition: A | Discharge status: Home / Self Care | Payer: BLUE CROSS/BLUE SHIELD | Source: Ambulatory Visit | Attending: Internal Medicine | Admitting: Internal Medicine

## 2019-03-07 ENCOUNTER — Other Ambulatory Visit: Payer: Self-pay

## 2019-03-07 DIAGNOSIS — R591 Generalized enlarged lymph nodes: Secondary | ICD-10-CM | POA: Diagnosis not present

## 2019-03-07 DIAGNOSIS — R590 Localized enlarged lymph nodes: Secondary | ICD-10-CM | POA: Diagnosis not present

## 2019-03-07 DIAGNOSIS — R928 Other abnormal and inconclusive findings on diagnostic imaging of breast: Secondary | ICD-10-CM

## 2019-03-07 HISTORY — PX: BREAST BIOPSY: SHX20

## 2019-03-09 LAB — SURGICAL PATHOLOGY

## 2020-01-30 IMAGING — MG MM BREAST LOCALIZATION CLIP
3 series · 3 of 3 positions shown · non-contrast
Comparison: Previous exam(s).

CLINICAL DATA: Post ultrasound-guided core needle biopsy of an
abnormally enlarged left axillary lymph node.

EXAM:
DIAGNOSTIC LEFT MAMMOGRAM POST ULTRASOUND BIOPSY

[L XCCL (1 of 2)]
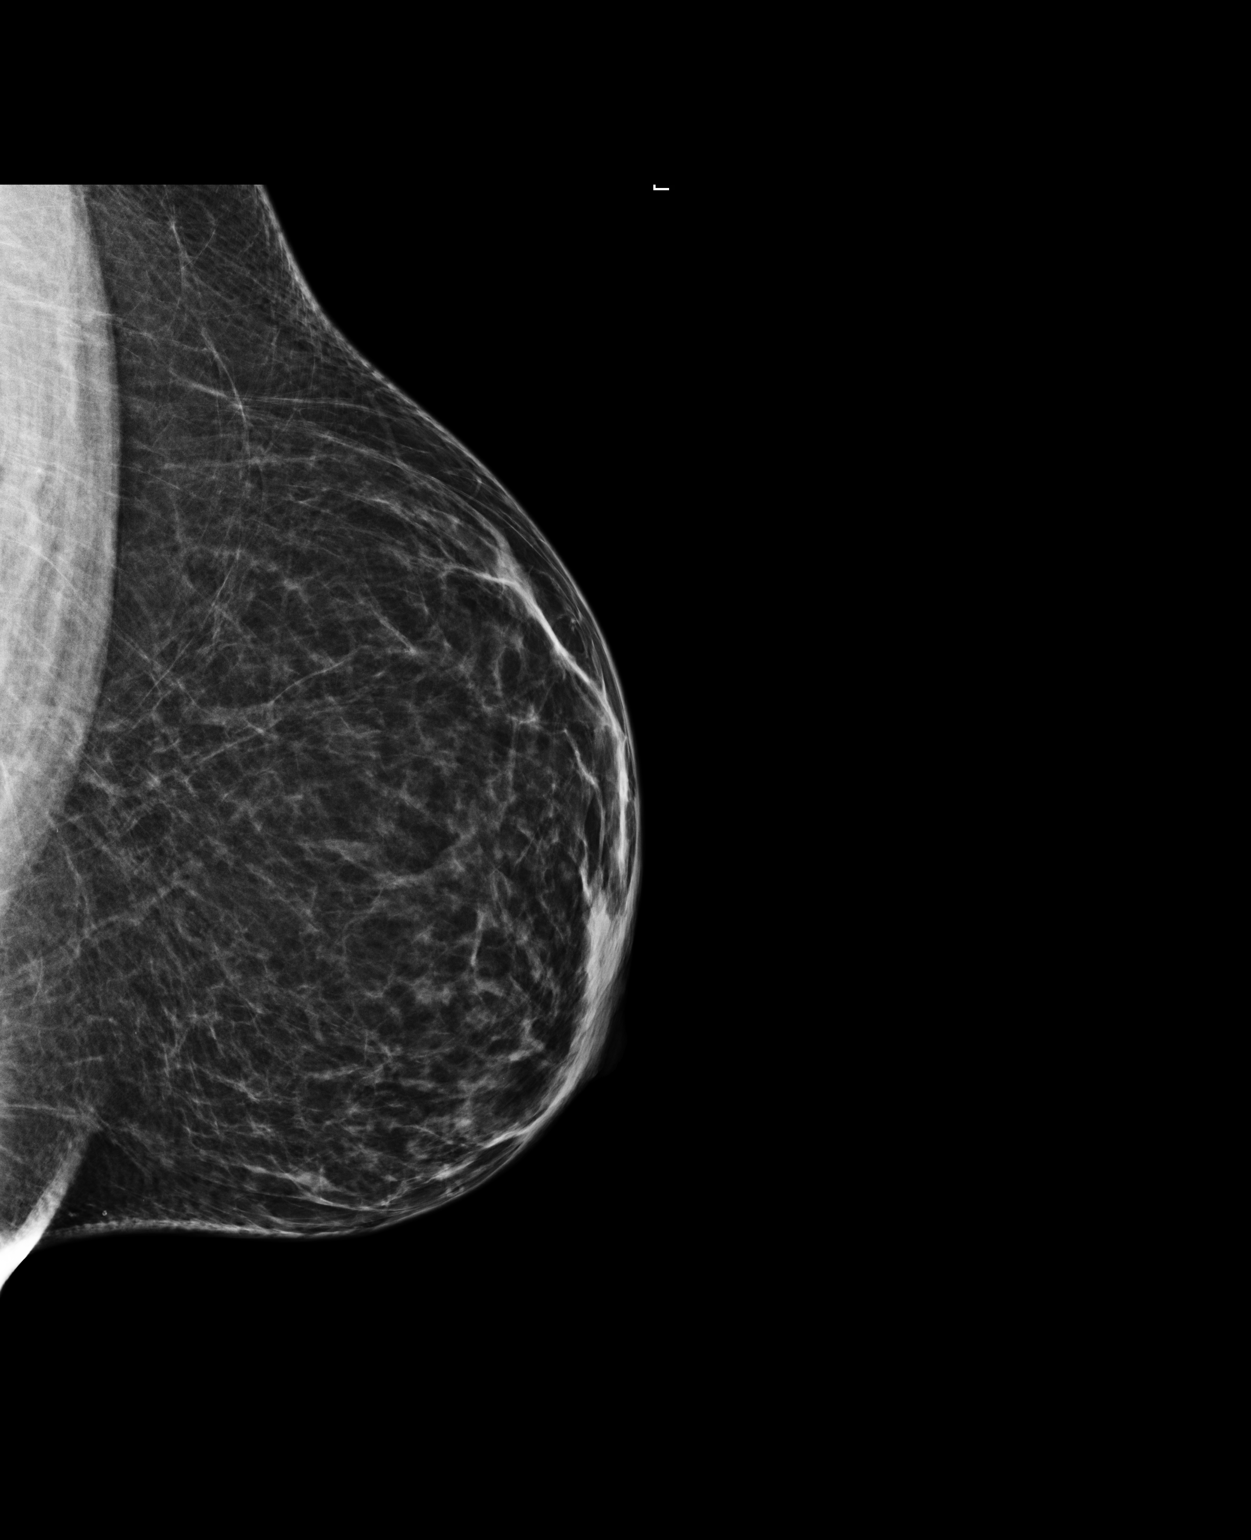

[L MLO]
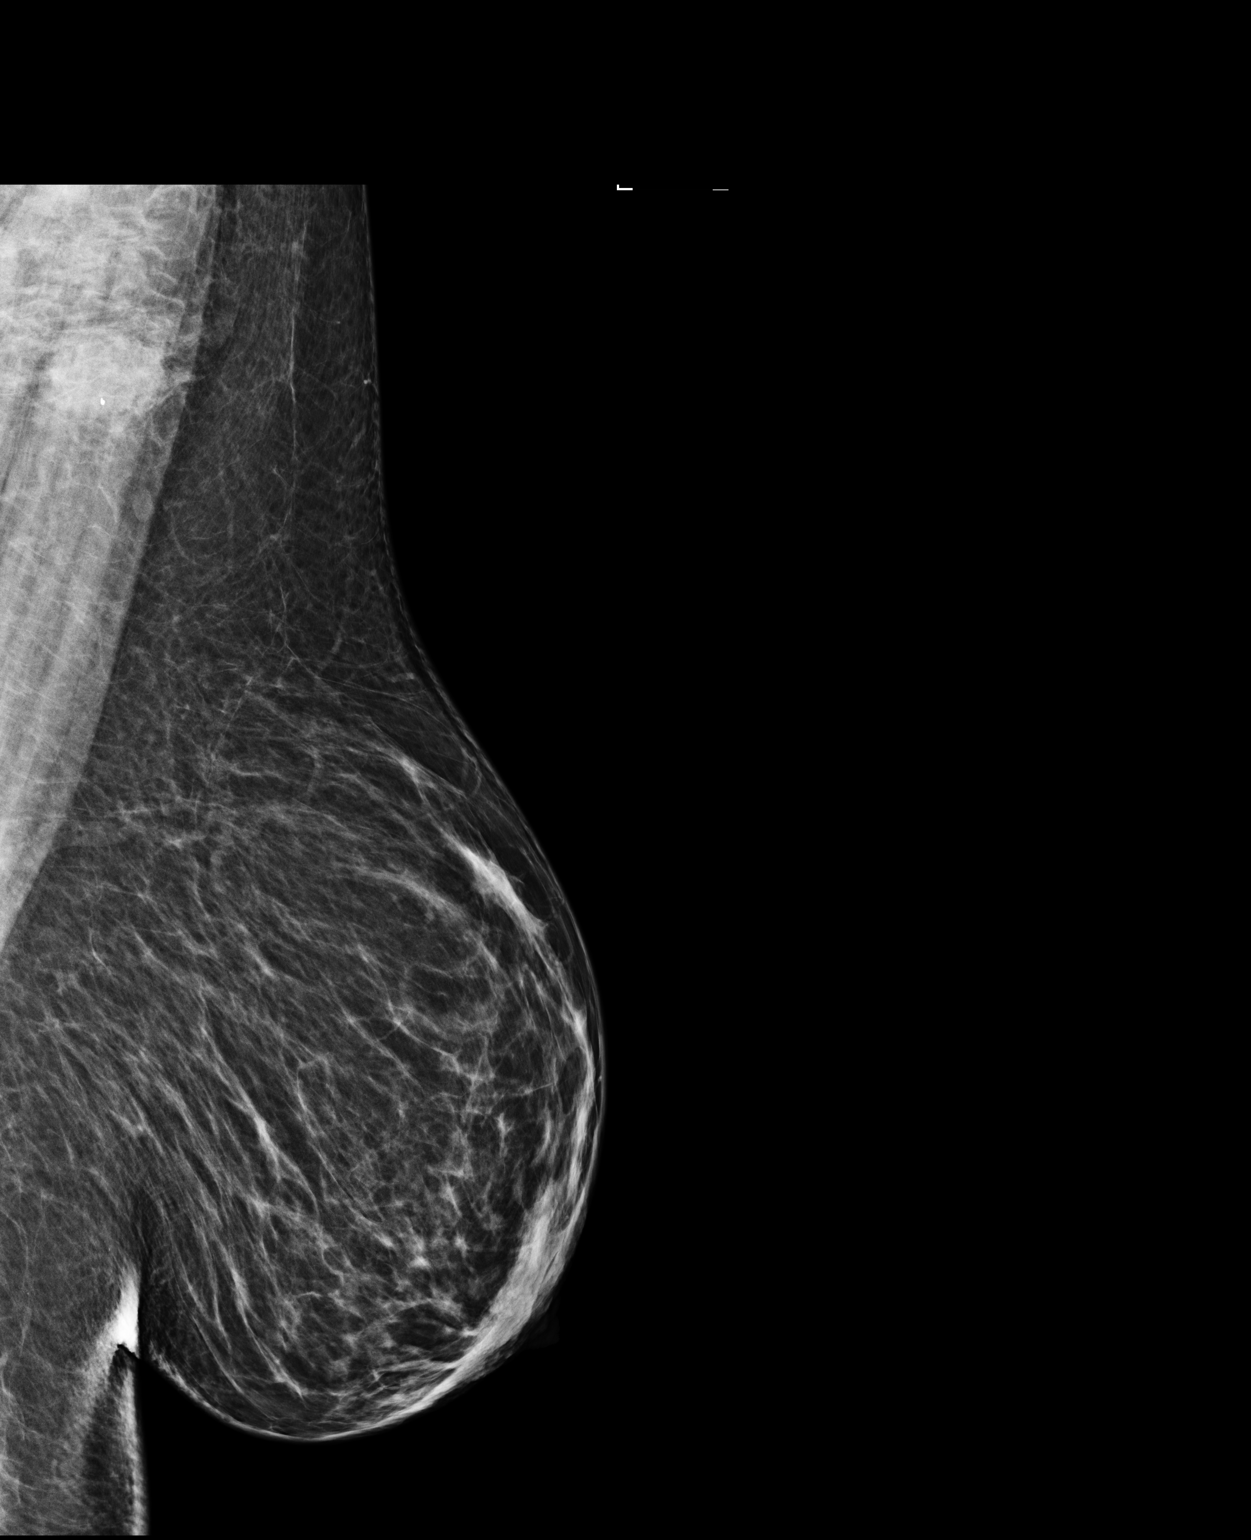

[L XCCL (2 of 2)]
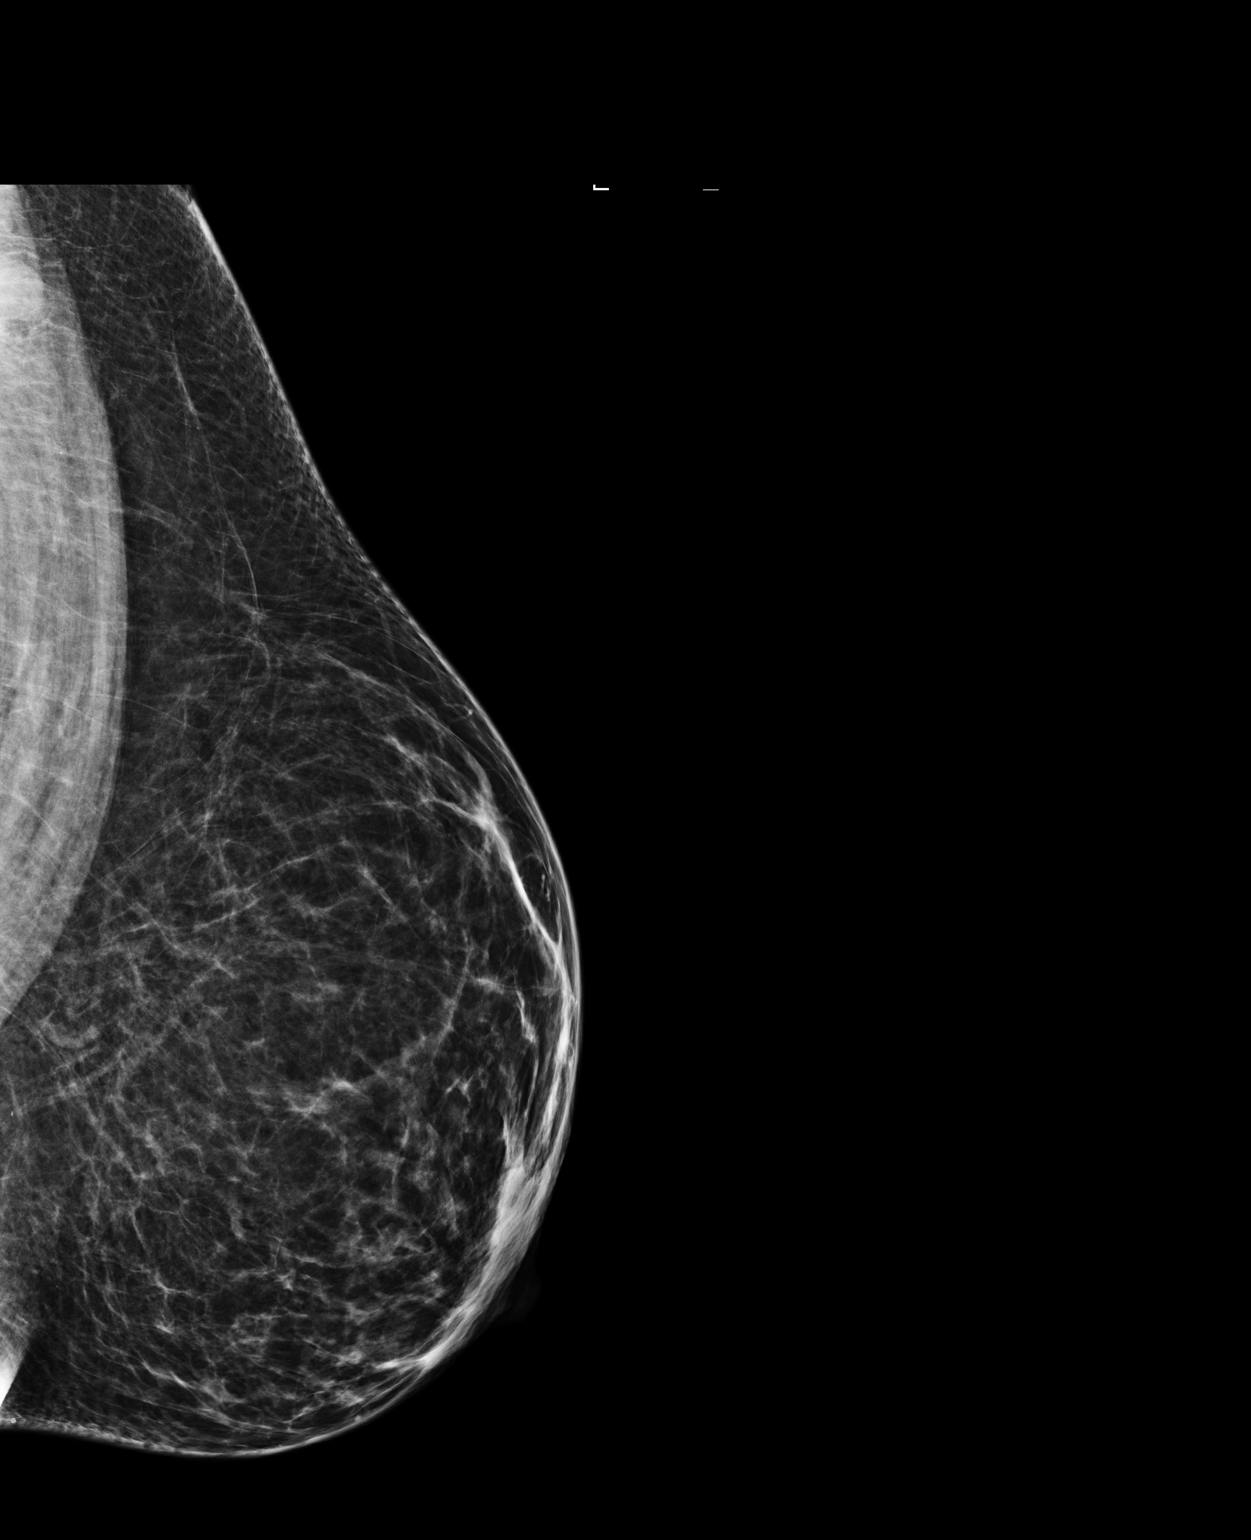

[3 of 3 positions shown; findings below may reference images not displayed]

FINDINGS: Mammographic images were obtained following ultrasound guided biopsy
of left axillary lymph node. Mammographic images demonstrate
presence of HydroMARK within a lower left axillary lymph node.
Expected post biopsy changes are seen.
IMPRESSION: Successful placement of HydroMARK post ultrasound-guided core needle
biopsy of a left axillary lymph node.

Final Assessment: Post Procedure Mammograms for Marker Placement

## 2020-02-22 DIAGNOSIS — J019 Acute sinusitis, unspecified: Secondary | ICD-10-CM | POA: Diagnosis not present

## 2020-02-22 DIAGNOSIS — E669 Obesity, unspecified: Secondary | ICD-10-CM | POA: Diagnosis not present

## 2020-02-22 DIAGNOSIS — L720 Epidermal cyst: Secondary | ICD-10-CM | POA: Diagnosis not present

## 2020-02-22 DIAGNOSIS — I10 Essential (primary) hypertension: Secondary | ICD-10-CM | POA: Diagnosis not present

## 2020-02-26 ENCOUNTER — Other Ambulatory Visit: Payer: Self-pay

## 2020-02-26 ENCOUNTER — Emergency Department
Admission: EM | Admit: 2020-02-26 | Discharge: 2020-02-26 | Disposition: A | Discharge status: Home / Self Care | Payer: BC Managed Care – PPO | Attending: Emergency Medicine | Admitting: Emergency Medicine

## 2020-02-26 ENCOUNTER — Encounter: Payer: Self-pay | Admitting: Emergency Medicine

## 2020-02-26 DIAGNOSIS — I10 Essential (primary) hypertension: Secondary | ICD-10-CM | POA: Diagnosis not present

## 2020-02-26 DIAGNOSIS — R339 Retention of urine, unspecified: Secondary | ICD-10-CM

## 2020-02-26 DIAGNOSIS — Z79899 Other long term (current) drug therapy: Secondary | ICD-10-CM | POA: Insufficient documentation

## 2020-02-26 DIAGNOSIS — N390 Urinary tract infection, site not specified: Secondary | ICD-10-CM | POA: Diagnosis not present

## 2020-02-26 DIAGNOSIS — F17200 Nicotine dependence, unspecified, uncomplicated: Secondary | ICD-10-CM | POA: Insufficient documentation

## 2020-02-26 LAB — URINALYSIS, COMPLETE (UACMP) WITH MICROSCOPIC
Bacteria, UA: NONE SEEN
Bilirubin Urine: NEGATIVE
Glucose, UA: NEGATIVE mg/dL
Hgb urine dipstick: NEGATIVE
Ketones, ur: NEGATIVE mg/dL
Leukocytes,Ua: NEGATIVE
Nitrite: POSITIVE — AB
Protein, ur: NEGATIVE mg/dL
Specific Gravity, Urine: 1.024 (ref 1.005–1.030)
Squamous Epithelial / LPF: NONE SEEN (ref 0–5)
pH: 5 (ref 5.0–8.0)

## 2020-02-26 MED ORDER — CEPHALEXIN 500 MG PO CAPS: 500.0000 mg | ORAL_CAPSULE | Freq: Two times a day (BID) | ORAL | 0 refills | Status: DC

## 2020-02-26 NOTE — ED Notes (Signed)
Urine sample sent to lab

## 2020-02-26 NOTE — ED Triage Notes (Signed)
Pt here with c/o urgency and difficulty urinating. Was put on antibiotic on Friday for UTI, another medicine for yeast infection, states she hasn't been able to "pee freely" since 0100 Saturday am, is having to strain hard to urinate, denies blood in urine. NAD.

## 2020-02-26 NOTE — ED Notes (Signed)
Bladder Scan performed 632 ml in bladder.

## 2020-02-26 NOTE — ED Notes (Signed)
Foley bag changed to leg bag, pt education provided and pt voices understanding of how to empty leg bag

## 2020-02-26 NOTE — ED Provider Notes (Signed)
[facility] Emergency Department Provider Note   ____________________________________________    I have reviewed the triage vital signs and the nursing notes.   HISTORY  Chief Complaint Urinary Tract Infection     HPI Yvonne Davis is a 39 y.o. female who presents with complaints of difficulty urinating.  Patient reports that last week she thought she might have a yeast infection and her PCP called in fluconazole for her.  Later in the week she felt like she may be developing a urinary tract infection so her PCP prescribed doxycycline.  She reports over the weekend she had difficulty urinating and has to strain extremely hard like she is having a bowel movement.  She denies fevers or chills.  She has never had this before.  Discontinue doxycycline over the weekend.  Past Medical History:  Diagnosis Date  . Heart palpitations   . Hypertension     Patient Active Problem List   Diagnosis Date Noted  . Hypertension     Past Surgical History:  Procedure Laterality Date  . ABDOMINAL HYSTERECTOMY    . BREAST BIOPSY Left 03/07/2019   left bc lymp node US bx path pending    Prior to Admission medications   Medication Sig Start Date End Date Taking? Authorizing Provider  cephALEXin (KEFLEX) 500 MG capsule Take 1 capsule (500 mg total) by mouth 2 (two) times daily. 02/26/20   Lavonia Drafts, MD  hydrochlorothiazide (HYDRODIURIL) 25 MG tablet Take 1 tablet (25 mg total) by mouth daily. 01/16/16   Earleen Newport, MD     Allergies Ace inhibitors  Family History  Problem Relation Age of Onset  . Cancer Father   . Breast cancer Maternal Uncle 42  . Breast cancer Maternal Grandmother 71    Social History Social History   Tobacco Use  . Smoking status: Current Every Day Smoker    Packs/day: 1.00  . Smokeless tobacco: Never Used  Substance Use Topics  . Alcohol use: No  . Drug use: Not on file    Review of Systems  Constitutional: No  fever/chills Eyes: No visual changes.  ENT: No sore throat. Cardiovascular: Denies chest pain. Respiratory: Denies shortness of breath. Gastrointestinal: As above Genitourinary: As above Musculoskeletal: Negative for back pain. Skin: Negative for rash. Neurological: Negative for headaches or weakness   ____________________________________________   PHYSICAL EXAM:  VITAL SIGNS: ED Triage Vitals  Enc Vitals Group     BP 02/26/20 0952 136/85     Pulse Rate 02/26/20 0952 (!) 102     Resp 02/26/20 0952 16     Temp 02/26/20 0952 98.4 F (36.9 C)     Temp Source 02/26/20 0952 Oral     SpO2 02/26/20 0952 98 %     Weight 02/26/20 0953 102.1 kg (225 lb)     Height 02/26/20 0953 1.702 m (5\' 7" )     Head Circumference --      Peak Flow --      Pain Score 02/26/20 0952 7     Pain Loc --      Pain Edu? --      Excl. in [place]? --     Constitutional: Alert and oriented.   Nose: No congestion/rhinnorhea. Mouth/Throat: Mucous membranes are moist.   Neck:  Painless ROM Cardiovascular: Normal rate, regular rhythm. Grossly normal heart sounds.  Good peripheral circulation. Respiratory: Normal respiratory effort.  No retractions. Lungs CTAB. Gastrointestinal: Mild suprapubic tenderness and fullness.. No distention.  No CVA  tenderness Genitourinary: deferred Musculoskeletal:   Warm and well perfused Neurologic:  Normal speech and language. No gross focal neurologic deficits are appreciated.  Skin:  Skin is warm, dry and intact. No rash noted. Psychiatric: Mood and affect are normal. Speech and behavior are normal.  ____________________________________________   LABS (all labs ordered are listed, but only abnormal results are displayed)  Labs Reviewed  URINALYSIS, COMPLETE (UACMP) WITH MICROSCOPIC - Abnormal; Notable for the following components:      Result Value   Color, Urine AMBER (*)    APPearance CLEAR (*)    Nitrite POSITIVE (*)    All other components within normal limits    URINE CULTURE   ____________________________________________  EKG  None ____________________________________________  RADIOLOGY   ____________________________________________   PROCEDURES  Procedure(s) performed: No  Procedures   Critical Care performed: No ____________________________________________   INITIAL IMPRESSION / ASSESSMENT AND PLAN / ED COURSE  Pertinent labs & imaging results that were available during my care of the patient were reviewed by me and considered in my medical decision making (see chart for details).  Patient presents with urinary retention, bladder scan demonstrates greater than 600 cc, will place Foley catheter obtain urinalysis.  Urinalysis demonstrates positive nitrites suspicious for urinary tract infection.  Will start on Keflex p.o., follow-up with urology for catheter removal further evaluation    ____________________________________________   FINAL CLINICAL IMPRESSION(S) / ED DIAGNOSES  Final diagnoses:  Lower urinary tract infectious disease  Urinary retention        Note:  This document was prepared using Dragon voice recognition software and may include unintentional dictation errors.   [name], MD 02/26/20 1500

## 2020-02-27 LAB — URINE CULTURE: Culture: NO GROWTH

## 2020-03-04 ENCOUNTER — Ambulatory Visit: Payer: BLUE CROSS/BLUE SHIELD | Admitting: Physician Assistant

## 2020-03-04 ENCOUNTER — Other Ambulatory Visit: Payer: Self-pay

## 2020-03-04 ENCOUNTER — Encounter: Payer: Self-pay | Admitting: Urology

## 2020-03-04 ENCOUNTER — Ambulatory Visit (INDEPENDENT_AMBULATORY_CARE_PROVIDER_SITE_OTHER): Payer: BC Managed Care – PPO | Admitting: Urology

## 2020-03-04 VITALS — BP 148/83 | HR 103 | Ht 67.0 in | Wt 234.0 lb

## 2020-03-04 DIAGNOSIS — R339 Retention of urine, unspecified: Secondary | ICD-10-CM

## 2020-03-04 LAB — URINALYSIS, COMPLETE
Bilirubin, UA: NEGATIVE
Glucose, UA: NEGATIVE
Ketones, UA: NEGATIVE
Nitrite, UA: POSITIVE — AB
Specific Gravity, UA: 1.02 (ref 1.005–1.030)
Urobilinogen, Ur: 0.2 mg/dL (ref 0.2–1.0)
pH, UA: 5 (ref 5.0–7.5)

## 2020-03-04 LAB — MICROSCOPIC EXAMINATION: RBC: >30 /hpf — AB (ref 0–2)

## 2020-03-04 LAB — BLADDER SCAN AMB NON-IMAGING: Scan Result: 16

## 2020-03-04 NOTE — Progress Notes (Signed)
03/04/2020 8:18 AM   Yvonne Davis November 21, 1981 [id number]  Referring provider: Corky Downs, MD [address] [place],  [place] [address]  Chief Complaint  Patient presents with  . Urinary Retention    HPI: I was consulted to assess the patient's acute urinary retention 1 week ago.  She was treated for yeast and then bladder infection but could not void without straining.  She was catheterized with 600 mL.  She was constipated at the time and this is normalized.  Normally flow was good.  Voids every 2 hours and gets a once or twice a night.  No neurologic issues.  No previous bladder surgery.  No hysterectomy.   PMH: Past Medical History:  Diagnosis Date  . Heart palpitations   . Hypertension     Surgical History: Past Surgical History:  Procedure Laterality Date  . ABDOMINAL HYSTERECTOMY    . BREAST BIOPSY Left 03/07/2019   left bc lymp node US bx path pending    Home Medications:  Allergies as of 03/04/2020      Reactions   Ace Inhibitors Swelling      Medication List       Accurate as of March 04, 2020  8:18 AM. If you have any questions, ask your nurse or doctor.        cephALEXin 500 MG capsule Commonly known as: KEFLEX Take 1 capsule (500 mg total) by mouth 2 (two) times daily.   hydrochlorothiazide 25 MG tablet Commonly known as: HYDRODIURIL Take 1 tablet (25 mg total) by mouth daily.       Allergies:  Allergies  Allergen Reactions  . Ace Inhibitors Swelling    Family History: Family History  Problem Relation Age of Onset  . Cancer Father   . Breast cancer Maternal Uncle 42  . Breast cancer Maternal Grandmother 73    Social History:  reports that she has been smoking. She has been smoking about 1.00 pack per day. She has never used smokeless tobacco. She reports that she does not drink alcohol. No history on file for drug.  ROS:                                        Physical Exam: BP (!) 148/83   Pulse  (!) 103   Ht 5\' 7"  (1.702 m)   Wt 106.1 kg   BMI 36.65 kg/m   Constitutional:  Alert and oriented, No acute distress. HEENT: [place] AT, moist mucus membranes.  Trachea midline, no masses. Cardiovascular: No clubbing, cyanosis, or edema. Respiratory: Normal respiratory effort, no increased work of breathing. GI: Abdomen is soft, nontender, nondistended, no abdominal masses GU: Alert with Foley catheter in place Skin: No rashes, bruises or suspicious lesions. Lymph: No cervical or inguinal adenopathy. Neurologic: Grossly intact, no focal deficits, moving all 4 extremities. Psychiatric: Normal mood and affect.  Laboratory Data: Lab Results  Component Value Date   WBC 10.0 01/16/2016   HGB 14.3 01/16/2016   HCT 41.6 01/16/2016   MCV 84.7 01/16/2016   PLT 311 01/16/2016    Lab Results  Component Value Date   CREATININE 0.76 01/16/2016    No results found for: PSA  No results found for: TESTOSTERONE  No results found for: HGBA1C  Urinalysis    Component Value Date/Time   COLORURINE AMBER (A) 02/26/2020 1143   APPEARANCEUR CLEAR (A) 02/26/2020 1143   APPEARANCEUR Hazy  05/04/2012 1513   LABSPEC 1.024 02/26/2020 1143   LABSPEC 1.013 05/04/2012 1513   PHURINE 5.0 02/26/2020 1143   GLUCOSEU NEGATIVE 02/26/2020 1143   GLUCOSEU Negative 05/04/2012 1513   HGBUR NEGATIVE 02/26/2020 1143   BILIRUBINUR NEGATIVE 02/26/2020 1143   BILIRUBINUR Negative 05/04/2012 [place] 02/26/2020 1143   PROTEINUR NEGATIVE 02/26/2020 1143   NITRITE POSITIVE (A) 02/26/2020 1143   LEUKOCYTESUR NEGATIVE 02/26/2020 1143   LEUKOCYTESUR Negative 05/04/2012 1513    Pertinent Imaging:   Assessment & Plan: Patient has urinary retention.  Pathophysiology described.  Trial of voiding see nurse practitioner with residual this afternoon.  Hopefully things have normalized.  If this recurs she can be reevaluated.  Based upon her age and risk factors if she does well this afternoon we will see  her as needed.  If there is any question we will check another residual in the next 1 to 3 weeks.  1. Urinary retention    No follow-ups on file.  Reece Packer, MD  [facility] [address] [place], [place] [address] [phone]

## 2020-03-04 NOTE — Progress Notes (Signed)
Fill and Pull Catheter Removal  Patient is present today for a catheter removal.  Patient was cleaned and prepped in a sterile fashion of sterile water/ saline was instilled into the bladder when the patient felt the urge to urinate. 35ml of water was then drained from the balloon.  A 14FR foley cath was removed from the bladder complications were noted as: Patient had a bladder spasm, was in some discomfort felt the urge to urinate .  Patient as then given some time to void on their own.  Patient cannot void on their own after some time.  Patient tolerated well.  Performed by: Milas Kocher, CMA  Follow up/ Additional notes: Return this afternoon for PVR, instructed to urinate as much as possible

## 2020-03-04 NOTE — Progress Notes (Signed)
Patient returned to clinic this afternoon for follow-up PVR.  She reports she has drank approximately 28 ounces of water during the day and urinated 3 times.  PVR 16 mL.  Voiding trial passed.  I had a lengthy conversation with the patient regarding the role of constipation and urinary tract infection and the development of urinary retention.  I counseled her to start a bowel regimen of MiraLAX to produce formed, easy to pass bowel movements on a regular basis.  She does not appear to have recurrent urinary tract infections, however she asked if she could start a cranberry supplement.  I am in agreement with this plan.  I counseled her to contact her office immediately if she develops warning signs of urinary retention including the sensation of incomplete voiding, dribbling stream, lower abdominal pain, or abdominal distention.  She expressed understanding.  Results for orders placed or performed in visit on 03/04/20  Bladder Scan (Post Void Residual) in office  Result Value Ref Range   Scan Result 16     I spent 15 minutes on the day of the encounter to include pre-visit record review, face-to-face time with the patient, and post-visit ordering of tests.   Carman Ching, PA-C  03/04/20  1:51 PM

## 2020-03-06 LAB — CULTURE, URINE COMPREHENSIVE

## 2020-03-08 ENCOUNTER — Telehealth: Payer: Self-pay

## 2020-03-08 MED ORDER — CIPROFLOXACIN HCL 250 MG PO TABS: 250.0000 mg | ORAL_TABLET | Freq: Two times a day (BID) | ORAL | 0 refills | Status: DC

## 2020-03-08 NOTE — Telephone Encounter (Signed)
Attempted to reach patient no answer could not leave vmail mailbox is full. Left a vmail on contact per DPR for patient to call.  Script was sent to pharmacy

## 2020-03-08 NOTE — Telephone Encounter (Signed)
-----   Message from Alfredo Martinez, MD sent at 03/07/2020  2:52 PM EDT ----- Ciprofloxacin 250 mg twice a day for 1 week      ----- Message ----- From: Lissa Hoard, CMA Sent: 03/06/2020  11:41 AM EDT To: Alfredo Martinez, MD   ----- Message ----- From: Interface, [facility] Lab Results In Sent: 03/04/2020   4:36 PM EDT To: [facility]

## 2020-03-08 NOTE — Telephone Encounter (Signed)
Called patient again no answer

## 2020-03-11 NOTE — Telephone Encounter (Signed)
Patient notified

## 2020-08-06 ENCOUNTER — Other Ambulatory Visit: Payer: Self-pay | Admitting: Internal Medicine

## 2020-08-21 ENCOUNTER — Other Ambulatory Visit: Payer: Self-pay

## 2020-08-21 ENCOUNTER — Other Ambulatory Visit: Payer: Self-pay | Admitting: Internal Medicine

## 2021-03-07 DIAGNOSIS — I208 Other forms of angina pectoris: Secondary | ICD-10-CM | POA: Diagnosis not present

## 2021-03-07 DIAGNOSIS — R000 Tachycardia, unspecified: Secondary | ICD-10-CM | POA: Diagnosis not present

## 2021-03-07 DIAGNOSIS — R0602 Shortness of breath: Secondary | ICD-10-CM | POA: Diagnosis not present

## 2021-03-07 DIAGNOSIS — R011 Cardiac murmur, unspecified: Secondary | ICD-10-CM | POA: Diagnosis not present

## 2022-04-07 ENCOUNTER — Ambulatory Visit: Payer: BC Managed Care – PPO | Admitting: Podiatry

## 2022-05-26 ENCOUNTER — Other Ambulatory Visit: Payer: Self-pay | Admitting: Internal Medicine

## 2022-10-28 ENCOUNTER — Ambulatory Visit
Admission: EM | Admit: 2022-10-28 | Discharge: 2022-10-28 | Disposition: A | Discharge status: Home / Self Care | Payer: BC Managed Care – PPO

## 2022-10-28 DIAGNOSIS — L0291 Cutaneous abscess, unspecified: Secondary | ICD-10-CM

## 2022-10-28 MED ORDER — DOXYCYCLINE HYCLATE 100 MG PO CAPS: 100.0000 mg | ORAL_CAPSULE | Freq: Two times a day (BID) | ORAL | 0 refills | Status: AC

## 2022-10-28 NOTE — ED Provider Notes (Signed)
Renaldo Fiddler    CSN: [id number] Arrival date & time: 10/28/22  1114      History   Chief Complaint Chief Complaint  Patient presents with   Abscess    HPI Yvonne Davis is a 41 y.o. female.  Patient presents with 4-day history of tender, swollen, red bump on her left upper chest.  No trauma.  No fever, chills, wound drainage, or other symptoms.  Treatment at home with warm compresses.  The area has gotten worse and the pain radiates to her shoulder now.  Her medical history includes hypertension.    The history is provided by the patient and medical records.    Past Medical History:  Diagnosis Date   Heart palpitations    Hypertension     Patient Active Problem List   Diagnosis Date Noted   Hypertension     Past Surgical History:  Procedure Laterality Date   ABDOMINAL HYSTERECTOMY     BREAST BIOPSY Left 03/07/2019   left bc lymp node US bx path pending    OB History   No obstetric history on file.      Home Medications    Prior to Admission medications   Medication Sig Start Date End Date Taking? Authorizing Provider  doxycycline (VIBRAMYCIN) 100 MG capsule Take 1 capsule (100 mg total) by mouth 2 (two) times daily for 7 days. 10/28/22 11/04/22 Yes Mickie Bail, NP  phentermine 30 MG capsule Take 25 mg by mouth every morning.   Yes [provider]  hydrochlorothiazide (HYDRODIURIL) 25 MG tablet Take 1 tablet (25 mg total) by mouth daily. 01/16/16   Emily Filbert, MD  telmisartan-hydrochlorothiazide (MICARDIS HCT) 80-25 MG tablet TAKE 1 TABLET BY MOUTH DAILY 05/26/22   Corky Downs, MD    Family History Family History  Problem Relation Age of Onset   Cancer Father    Breast cancer Maternal Uncle 26   Breast cancer Maternal Grandmother 18    Social History Social History   Tobacco Use   Smoking status: Every Day    Packs/day: 1.00    Types: Cigarettes   Smokeless tobacco: Never  Substance Use Topics   Alcohol use: No      Allergies   Ace inhibitors   Review of Systems Review of Systems  Constitutional:  Negative for chills and fever.  Musculoskeletal:  Positive for arthralgias. Negative for joint swelling.  Skin:  Positive for color change and wound.  Neurological:  Negative for weakness and numbness.  All other systems reviewed and are negative.    Physical Exam Triage Vital Signs ED Triage Vitals  Enc Vitals Group     BP      Pulse      Resp      Temp      Temp src      SpO2      Weight      Height      Head Circumference      Peak Flow      Pain Score      Pain Loc      Pain Edu?      Excl. in GC?    No data found.  Updated Vital Signs BP 117/77   Pulse 93   Temp 97.9 F (36.6 C)   Resp 18   Ht 5\' 6"  (1.676 m)   Wt 220 lb (99.8 kg)   SpO2 97%   BMI 35.51 kg/m   Visual Acuity Right Eye  Distance:   Left Eye Distance:   Bilateral Distance:    Right Eye Near:   Left Eye Near:    Bilateral Near:     Physical Exam Vitals and nursing note reviewed.  Constitutional:      General: She is not in acute distress.    Appearance: She is well-developed. She is not ill-appearing.  HENT:     Mouth/Throat:     Mouth: Mucous membranes are moist.  Cardiovascular:     Rate and Rhythm: Normal rate and regular rhythm.     Heart sounds: Normal heart sounds.  Pulmonary:     Effort: Pulmonary effort is normal. No respiratory distress.     Breath sounds: Normal breath sounds.  Chest:       Comments: Needle aspiration of the mass with scant blood return only.   Musculoskeletal:        General: Tenderness present. No deformity. Normal range of motion.     Cervical back: Neck supple.  Skin:    General: Skin is warm and dry.     Capillary Refill: Capillary refill takes less than 2 seconds.     Findings: Erythema and lesion present.  Neurological:     General: No focal deficit present.     Mental Status: She is alert and oriented to person, place, and time.     Sensory:  No sensory deficit.     Motor: No weakness.  Psychiatric:        Mood and Affect: Mood normal.        Behavior: Behavior normal.      UC Treatments / Results  Labs (all labs ordered are listed, but only abnormal results are displayed) Labs Reviewed - No data to display  EKG   Radiology No results found.  Procedures Procedures (including critical care time)  Medications Ordered in UC Medications - No data to display  Initial Impression / Assessment and Plan / UC Course  I have reviewed the triage vital signs and the nursing notes.  Pertinent labs & imaging results that were available during my care of the patient were reviewed by me and considered in my medical decision making (see chart for details).    Abscess of left upper chest.  The mass appears to be an abscess based on the redness and tenderness.  Treating with doxycycline.  No I&D indicated today based on needle aspiration.  Education provided on skin abscess.  Instructed patient to follow up with her PCP if her symptoms are not improving.  She agrees to plan of care.    Final Clinical Impressions(s) / UC Diagnoses   Final diagnoses:  Abscess     Discharge Instructions      Take the doxycycline as directed.   Follow up with your primary care provider if your symptoms are not improving.           ED Prescriptions     Medication Sig Dispense Auth. Provider   doxycycline (VIBRAMYCIN) 100 MG capsule Take 1 capsule (100 mg total) by mouth 2 (two) times daily for 7 days. 14 capsule Mickie Bail, NP      PDMP not reviewed this encounter.   Mickie Bail, NP 10/28/22 1212

## 2022-10-28 NOTE — Discharge Instructions (Addendum)
Take the doxycycline as directed.    Follow up with your primary care provider if your symptoms are not improving.    

## 2022-10-28 NOTE — ED Triage Notes (Signed)
Patient to Urgent Care with complaints of  abscess present to left upper chest, area is red, warm, and inflamed. Denies any drainage. Reports there has always been a small bump there but on Saturday it started to become irritated and swollen.  Denies any known fevers. Reports pain radiates into her left shoulder.

## 2022-12-29 ENCOUNTER — Ambulatory Visit
Admission: RE | Admit: 2022-12-29 | Discharge: 2022-12-29 | Disposition: A | Discharge status: Home / Self Care | Payer: BC Managed Care – PPO | Source: Ambulatory Visit | Attending: Emergency Medicine | Admitting: Emergency Medicine

## 2022-12-29 VITALS — BP 111/70 | HR 109 | Temp 97.7°F | Resp 18 | Ht 66.0 in | Wt 230.0 lb

## 2022-12-29 DIAGNOSIS — R202 Paresthesia of skin: Secondary | ICD-10-CM

## 2022-12-29 DIAGNOSIS — M5442 Lumbago with sciatica, left side: Secondary | ICD-10-CM

## 2022-12-29 LAB — POCT URINALYSIS DIP (MANUAL ENTRY)
Bilirubin, UA: NEGATIVE
Glucose, UA: NEGATIVE mg/dL
Ketones, POC UA: NEGATIVE mg/dL
Leukocytes, UA: NEGATIVE
Nitrite, UA: NEGATIVE
Protein Ur, POC: NEGATIVE mg/dL
Spec Grav, UA: >=1.03 — AB (ref 1.010–1.025)
Urobilinogen, UA: 0.2 E.U./dL
pH, UA: 6 (ref 5.0–8.0)

## 2022-12-29 MED ORDER — CYCLOBENZAPRINE HCL 10 MG PO TABS: 10.0000 mg | ORAL_TABLET | Freq: Two times a day (BID) | ORAL | 0 refills | Status: DC | PRN

## 2022-12-29 MED ORDER — NAPROXEN 500 MG PO TABS: 500.0000 mg | ORAL_TABLET | Freq: Two times a day (BID) | ORAL | 0 refills | Status: DC

## 2022-12-29 NOTE — Discharge Instructions (Addendum)
Take naproxen as directed.  Take the muscle relaxer Flexeril as needed for muscle spasm; Do not drive, operate machinery, or drink alcohol with this medication as it can cause drowsiness.   Follow up with an orthopedist.

## 2022-12-29 NOTE — ED Provider Notes (Signed)
[name]    CSN: [id number] Arrival date & time: 12/29/22  0805      History   Chief Complaint Chief Complaint  Patient presents with   Back Pain    Entered by patient    HPI Yvonne Davis is a 42 y.o. female.  Patient present with lower back pain x 2 weeks.  No falls or injury.  The pain radiates down her left leg.  It is worse with movement and weight bearing.  She also reports numbness in bilateral toes x 3-4 months.  No weakness, saddle anesthesia, loss of bowel/bladder control, fever, abdominal pain, dysuria, hematuria, wounds, redness, bruising, or other symptoms.  Treatment at home with Biofreeze and Tiger balm.  Her medical history includes hypertension.   The history is provided by the patient and medical records.    Past Medical History:  Diagnosis Date   Heart palpitations    Hypertension     Patient Active Problem List   Diagnosis Date Noted   Hypertension     Past Surgical History:  Procedure Laterality Date   ABDOMINAL HYSTERECTOMY     BREAST BIOPSY Left 03/07/2019   left bc lymp node US bx path pending    OB History   No obstetric history on file.      Home Medications    Prior to Admission medications   Medication Sig Start Date End Date Taking? Authorizing Provider  cyclobenzaprine (FLEXERIL) 10 MG tablet Take 1 tablet (10 mg total) by mouth 2 (two) times daily as needed for muscle spasms. 12/29/22  Yes Sharion Balloon, NP  naproxen (NAPROSYN) 500 MG tablet Take 1 tablet (500 mg total) by mouth 2 (two) times daily. 12/29/22  Yes Sharion Balloon, NP  hydrochlorothiazide (HYDRODIURIL) 25 MG tablet Take 1 tablet (25 mg total) by mouth daily. 01/16/16   Earleen Newport, MD  phentermine 30 MG capsule Take 25 mg by mouth every morning.    [provider]  telmisartan-hydrochlorothiazide (MICARDIS HCT) 80-25 MG tablet TAKE 1 TABLET BY MOUTH DAILY 05/26/22   Cletis Athens, MD    Family History Family History  Problem Relation Age  of Onset   Cancer Father    Breast cancer Maternal Uncle 3   Breast cancer Maternal Grandmother 71    Social History Social History   Tobacco Use   Smoking status: Every Day    Packs/day: 1.00    Types: Cigarettes   Smokeless tobacco: Never  Substance Use Topics   Alcohol use: No   Drug use: Never     Allergies   Ace inhibitors   Review of Systems Review of Systems  Constitutional:  Negative for chills and fever.  Gastrointestinal:  Negative for abdominal pain, constipation, diarrhea, nausea and vomiting.  Genitourinary:  Negative for dysuria, flank pain and hematuria.  Musculoskeletal:  Positive for back pain. Negative for arthralgias, gait problem and joint swelling.  Skin:  Negative for color change, rash and wound.  Neurological:  Positive for numbness. Negative for weakness.  All other systems reviewed and are negative.    Physical Exam Triage Vital Signs ED Triage Vitals  Enc Vitals Group     BP      Pulse      Resp      Temp      Temp src      SpO2      Weight      Height      Head Circumference  Peak Flow      Pain Score      Pain Loc      Pain Edu?      Excl. in GC?    No data found.  Updated Vital Signs BP 111/70   Pulse (!) 109   Temp 97.7 F (36.5 C)   Resp 18   Ht 5\' 6"  (1.676 m)   Wt 230 lb (104.3 kg)   SpO2 95%   BMI 37.12 kg/m   Visual Acuity Right Eye Distance:   Left Eye Distance:   Bilateral Distance:    Right Eye Near:   Left Eye Near:    Bilateral Near:     Physical Exam Vitals and nursing note reviewed.  Constitutional:      General: She is not in acute distress.    Appearance: Normal appearance. She is well-developed. She is not ill-appearing.  HENT:     Mouth/Throat:     Mouth: Mucous membranes are moist.  Cardiovascular:     Rate and Rhythm: Normal rate and regular rhythm.     Heart sounds: Normal heart sounds.  Pulmonary:     Effort: Pulmonary effort is normal. No respiratory distress.     Breath  sounds: Normal breath sounds.  Abdominal:     General: Bowel sounds are normal.     Palpations: Abdomen is soft.     Tenderness: There is no abdominal tenderness. There is no right CVA tenderness, left CVA tenderness, guarding or rebound.  Musculoskeletal:        General: No swelling, tenderness, deformity or signs of injury. Normal range of motion.     Cervical back: Neck supple.  Skin:    General: Skin is warm and dry.     Capillary Refill: Capillary refill takes less than 2 seconds.     Findings: No bruising, erythema, lesion or rash.  Neurological:     General: No focal deficit present.     Mental Status: She is alert and oriented to person, place, and time.     Sensory: No sensory deficit.     Motor: No weakness.     Gait: Gait normal.  Psychiatric:        Mood and Affect: Mood normal.        Behavior: Behavior normal.      UC Treatments / Results  Labs (all labs ordered are listed, but only abnormal results are displayed) Labs Reviewed  POCT URINALYSIS DIP (MANUAL ENTRY) - Abnormal; Notable for the following components:      Result Value   Clarity, UA cloudy (*)    Spec Grav, UA >=1.030 (*)    Blood, UA trace-intact (*)    All other components within normal limits    EKG   Radiology No results found.  Procedures Procedures (including critical care time)  Medications Ordered in UC Medications - No data to display  Initial Impression / Assessment and Plan / UC Course  I have reviewed the triage vital signs and the nursing notes.  Pertinent labs & imaging results that were available during my care of the patient were reviewed by me and considered in my medical decision making (see chart for details).   Acute low back pain with left side sciatica, Paresthesias.  Treating with naproxen and Flexeril.  Precautions for drowsiness with Flexeril discussed.  Instructed patient to follow-up with orthopedics.  Contact information for on-call Ortho provided.  Education  provided on sciatica and paresthesias.  Patient agrees to plan of  care.   Final Clinical Impressions(s) / UC Diagnoses   Final diagnoses:  Acute bilateral low back pain with left-sided sciatica  Paresthesias     Discharge Instructions      Take naproxen as directed.  Take the muscle relaxer Flexeril as needed for muscle spasm; Do not drive, operate machinery, or drink alcohol with this medication as it can cause drowsiness.   Follow up with an orthopedist.         ED Prescriptions     Medication Sig Dispense Auth. Provider   cyclobenzaprine (FLEXERIL) 10 MG tablet Take 1 tablet (10 mg total) by mouth 2 (two) times daily as needed for muscle spasms. 20 tablet Mickie Bail, NP   naproxen (NAPROSYN) 500 MG tablet Take 1 tablet (500 mg total) by mouth 2 (two) times daily. 30 tablet Mickie Bail, NP      I have reviewed the PDMP during this encounter.   Mickie Bail, NP 12/29/22 [phone]

## 2022-12-29 NOTE — ED Triage Notes (Signed)
Patient to Urgent Care with complaints of lower back pain. Denies any known injury. Denies any history of injury.   Symptoms started two weeks ago. Reports initially pain started in her right hip that radiated into her lower back. Now concentrated in her left lower back. Describes pain as burning/ numbness. Pain radiates into left leg down into left foot. Worse when moving and standing.

## 2022-12-30 DIAGNOSIS — M5416 Radiculopathy, lumbar region: Secondary | ICD-10-CM | POA: Diagnosis not present

## 2022-12-30 DIAGNOSIS — M5442 Lumbago with sciatica, left side: Secondary | ICD-10-CM | POA: Diagnosis not present

## 2023-01-06 DIAGNOSIS — M5416 Radiculopathy, lumbar region: Secondary | ICD-10-CM | POA: Diagnosis not present

## 2023-01-08 DIAGNOSIS — M5416 Radiculopathy, lumbar region: Secondary | ICD-10-CM | POA: Diagnosis not present

## 2023-01-13 DIAGNOSIS — M5416 Radiculopathy, lumbar region: Secondary | ICD-10-CM | POA: Diagnosis not present

## 2023-01-15 DIAGNOSIS — M5416 Radiculopathy, lumbar region: Secondary | ICD-10-CM | POA: Diagnosis not present

## 2023-04-06 ENCOUNTER — Encounter: Payer: Self-pay | Admitting: Nurse Practitioner

## 2023-04-06 ENCOUNTER — Ambulatory Visit: Payer: BC Managed Care – PPO | Admitting: Nurse Practitioner

## 2023-04-06 VITALS — BP 103/63 | HR 86 | Temp 97.7°F | Resp 16 | Ht 66.0 in | Wt 256.8 lb

## 2023-04-06 DIAGNOSIS — R7301 Impaired fasting glucose: Secondary | ICD-10-CM

## 2023-04-06 DIAGNOSIS — I10 Essential (primary) hypertension: Secondary | ICD-10-CM

## 2023-04-06 DIAGNOSIS — M7701 Medial epicondylitis, right elbow: Secondary | ICD-10-CM | POA: Diagnosis not present

## 2023-04-06 DIAGNOSIS — E538 Deficiency of other specified B group vitamins: Secondary | ICD-10-CM

## 2023-04-06 DIAGNOSIS — F419 Anxiety disorder, unspecified: Secondary | ICD-10-CM

## 2023-04-06 DIAGNOSIS — G5793 Unspecified mononeuropathy of bilateral lower limbs: Secondary | ICD-10-CM

## 2023-04-06 DIAGNOSIS — R928 Other abnormal and inconclusive findings on diagnostic imaging of breast: Secondary | ICD-10-CM

## 2023-04-06 DIAGNOSIS — Z1231 Encounter for screening mammogram for malignant neoplasm of breast: Secondary | ICD-10-CM

## 2023-04-06 DIAGNOSIS — E782 Mixed hyperlipidemia: Secondary | ICD-10-CM

## 2023-04-06 DIAGNOSIS — E559 Vitamin D deficiency, unspecified: Secondary | ICD-10-CM

## 2023-04-06 DIAGNOSIS — R635 Abnormal weight gain: Secondary | ICD-10-CM

## 2023-04-06 MED ORDER — CYCLOBENZAPRINE HCL 10 MG PO TABS: 10.0000 mg | ORAL_TABLET | Freq: Every evening | ORAL | 0 refills | Status: DC | PRN

## 2023-04-06 MED ORDER — ESCITALOPRAM OXALATE 5 MG PO TABS: 5.0000 mg | ORAL_TABLET | Freq: Every day | ORAL | 3 refills | Status: DC

## 2023-04-06 NOTE — Progress Notes (Signed)
[facility] [address] [place], [place] [address]  Internal MEDICINE  Office Visit Note  Patient Name: Yvonne Davis  [id number]  [id number]  Date of Service: 04/06/2023   Complaints/HPI Pt is here for establishment of PCP. Chief Complaint  Patient presents with   New Patient (Initial Visit)    Discuss weight and tingling in feet, lab work     HPI Yvonne Davis presents for a new patient visit to establish care.  Well-appearing 42 y.o. female with hypertension and anxiety.  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  Diet: regular diet  Exercise: walking a lot. Feet hurt a lot  Tobacco use: 1/2-1 ppd cigarettes Alcohol use: none  Illicit drug use: none  Grandfather had colon cancer, colonoscopy due next year Routine mammogram: done in 2019, overdue Had Uti then became a bladder infection  Labs: due for routine labs  New or worsening pain: feet bilateral nerve pain and numbness Right elbow pain with decreased ROM and strength in the right elbow and the right hand.    Current Medication: Outpatient Encounter Medications as of 04/06/2023  Medication Sig   cyclobenzaprine (FLEXERIL) 10 MG tablet Take 1 tablet (10 mg total) by mouth at bedtime as needed for muscle spasms.   escitalopram (LEXAPRO) 5 MG tablet Take 1 tablet (5 mg total) by mouth daily.   telmisartan-hydrochlorothiazide (MICARDIS HCT) 80-25 MG tablet TAKE 1 TABLET BY MOUTH DAILY   [DISCONTINUED] cyclobenzaprine (FLEXERIL) 10 MG tablet Take 1 tablet (10 mg total) by mouth 2 (two) times daily as needed for muscle spasms. (Patient not taking: Reported on 04/06/2023)   [DISCONTINUED] hydrochlorothiazide (HYDRODIURIL) 25 MG tablet Take 1 tablet (25 mg total) by mouth daily. (Patient not taking: Reported on 04/06/2023)   [DISCONTINUED] naproxen (NAPROSYN) 500 MG tablet Take 1 tablet (500 mg total) by mouth 2 (two) times daily. (Patient  not taking: Reported on 04/06/2023)   [DISCONTINUED] phentermine 30 MG capsule Take 25 mg by mouth every morning. (Patient not taking: Reported on 04/06/2023)   No facility-administered encounter medications on file as of 04/06/2023.    Surgical History: Past Surgical History:  Procedure Laterality Date   ABDOMINAL HYSTERECTOMY     BREAST BIOPSY Left 03/07/2019   left bc lymp node US bx path pending    Medical History: Past Medical History:  Diagnosis Date   Heart palpitations    Hypertension     Family History: Family History  Problem Relation Age of Onset   Cancer Father    Breast cancer Maternal Uncle 66   Breast cancer Maternal Grandmother 74    Social History   Socioeconomic History   Marital status: Single    Spouse name: Not on file   Number of children: Not on file   Years of education: Not on file   Highest education level: Not on file  Occupational History   Not on file  Tobacco Use   Smoking status: Every Day    Packs/day: 1    Types: Cigarettes   Smokeless tobacco: Never  Substance and Sexual Activity   Alcohol use: No   Drug use: Never   Sexual activity: Yes  Other Topics Concern   Not on file  Social History Narrative   Not on file   Social Determinants of Health   Financial Resource Strain: Not on file  Food Insecurity: Not on file  Transportation Needs: Not on file  Physical  Activity: Not on file  Stress: Not on file  Social Connections: Not on file  Intimate Partner Violence: Not on file     Review of Systems  Constitutional:  Positive for fatigue and unexpected weight change. Negative for chills.  HENT:  Positive for postnasal drip. Negative for congestion, rhinorrhea, sneezing and sore throat.   Eyes:  Negative for redness.  Respiratory: Negative.  Negative for cough, chest tightness, shortness of breath and wheezing.   Cardiovascular: Negative.  Negative for chest pain and palpitations.  Gastrointestinal: Negative.  Negative for  abdominal pain, constipation, diarrhea, nausea and vomiting.  Endocrine: Positive for polyphagia.  Genitourinary:  Negative for dysuria and frequency.  Musculoskeletal:  Positive for arthralgias (right elbow, right hand). Negative for back pain, joint swelling and neck pain.       Feet hurt  Skin:  Negative for rash.  Neurological: Negative.  Negative for tremors and numbness.  Psychiatric/Behavioral:  Negative for behavioral problems (Depression), sleep disturbance and suicidal ideas. The patient is not nervous/anxious.     Vital Signs: BP 103/63   Pulse 86   Temp 97.7 F (36.5 C)   Resp 16   Ht 5\' 6"  (1.676 m)   Wt 256 lb 12.8 oz (116.5 kg)   SpO2 96%   BMI 41.45 kg/m    Physical Exam Vitals reviewed.  Constitutional:      General: She is not in acute distress.    Appearance: Normal appearance. She is obese. She is not ill-appearing.  HENT:     Head: Normocephalic and atraumatic.  Eyes:     Pupils: Pupils are equal, round, and reactive to light.  Cardiovascular:     Rate and Rhythm: Normal rate and regular rhythm.  Pulmonary:     Effort: Pulmonary effort is normal. No respiratory distress.  Neurological:     Mental Status: She is alert and oriented to person, place, and time.  Psychiatric:        Mood and Affect: Mood normal.        Behavior: Behavior normal.       Assessment/Plan: 1. Impaired fasting glucose A1c ordered to screen for diabetes - Hgb A1C w/o eAG  2. Abnormal weight gain Routine labs ordered - CBC with Differential/Platelet - CMP14+EGFR - Lipid Profile - Hgb A1C w/o eAG - B12 and Folate Panel  3. Mixed hyperlipidemia Routine labs ordered - CMP14+EGFR - Lipid Profile - Hgb A1C w/o eAG - Vitamin D (25 hydroxy)  4. Epicondylitis elbow, medial, right Muscle relaxant prescribed and stretches discussed that will help alleviate pain - cyclobenzaprine (FLEXERIL) 10 MG tablet; Take 1 tablet (10 mg total) by mouth at bedtime as needed for  muscle spasms.  Dispense: 30 tablet; Refill: 0  5. B12 deficiency Routine labs ordered - CBC with Differential/Platelet - B12 and Folate Panel  6. Vitamin D deficiency Routine lab ordered - Vitamin D (25 hydroxy)  7. Encounter for screening mammogram for malignant neoplasm of breast Routine mammogram ordered - MM 3D SCREENING MAMMOGRAM BILATERAL BREAST; Future  8. Anxiety Start escitalopram for anxiety, follow up in 4 weeks  - escitalopram (LEXAPRO) 5 MG tablet; Take 1 tablet (5 mg total) by mouth daily.  Dispense: 30 tablet; Refill: 3    General Counseling: Zabella verbalizes understanding of the findings of todays visit and agrees with plan of treatment. I have discussed any further diagnostic evaluation that may be needed or ordered today. We also reviewed her medications today. she has been encouraged to call  the office with any questions or concerns that should arise related to todays visit.    Orders Placed This Encounter  Procedures   MM 3D DIAGNOSTIC MAMMOGRAM BILATERAL BREAST   [place] LIMITED ULTRASOUND INCLUDING AXILLA LEFT BREAST    [place] LIMITED ULTRASOUND INCLUDING AXILLA RIGHT BREAST   CBC with Differential/Platelet   CMP14+EGFR   Lipid Profile   Hgb A1C w/o eAG   B12 and Folate Panel   Vitamin D (25 hydroxy)    Meds ordered this encounter  Medications   cyclobenzaprine (FLEXERIL) 10 MG tablet    Sig: Take 1 tablet (10 mg total) by mouth at bedtime as needed for muscle spasms.    Dispense:  30 tablet    Refill:  0   escitalopram (LEXAPRO) 5 MG tablet    Sig: Take 1 tablet (5 mg total) by mouth daily.    Dispense:  30 tablet    Refill:  3    Return for CPE/PAP, Clarence Dunsmore PCP at earliest available opening, have labs done prior to visit. .  Time spent:30 Minutes Time spent with patient included reviewing progress notes, labs, imaging studies, and discussing plan for follow up.   [place] Controlled Substance Database was reviewed by me for overdose risk score  (ORS)   This patient was seen by Sallyanne Kuster, FNP-C in collaboration with Dr. Beverely Risen as a part of collaborative care agreement.   Tachina Spoonemore R. Tedd Sias, MSN, FNP-C Internal Medicine

## 2023-04-14 ENCOUNTER — Ambulatory Visit
Admission: RE | Admit: 2023-04-14 | Discharge: 2023-04-14 | Disposition: A | Discharge status: Home / Self Care | Payer: BC Managed Care – PPO | Source: Ambulatory Visit | Attending: Nurse Practitioner | Admitting: Nurse Practitioner

## 2023-04-14 DIAGNOSIS — Z1231 Encounter for screening mammogram for malignant neoplasm of breast: Secondary | ICD-10-CM | POA: Insufficient documentation

## 2023-04-15 ENCOUNTER — Other Ambulatory Visit: Payer: Self-pay | Admitting: Nurse Practitioner

## 2023-04-15 DIAGNOSIS — N6489 Other specified disorders of breast: Secondary | ICD-10-CM

## 2023-04-15 DIAGNOSIS — R928 Other abnormal and inconclusive findings on diagnostic imaging of breast: Secondary | ICD-10-CM

## 2023-04-17 ENCOUNTER — Encounter: Payer: Self-pay | Admitting: Nurse Practitioner

## 2023-04-22 ENCOUNTER — Ambulatory Visit
Admission: RE | Admit: 2023-04-22 | Discharge: 2023-04-22 | Disposition: A | Discharge status: Home / Self Care | Payer: BC Managed Care – PPO | Source: Ambulatory Visit | Attending: Nurse Practitioner | Admitting: Nurse Practitioner

## 2023-04-22 ENCOUNTER — Other Ambulatory Visit: Payer: Self-pay | Admitting: Nurse Practitioner

## 2023-04-22 DIAGNOSIS — R928 Other abnormal and inconclusive findings on diagnostic imaging of breast: Secondary | ICD-10-CM

## 2023-04-22 DIAGNOSIS — N6489 Other specified disorders of breast: Secondary | ICD-10-CM

## 2023-04-27 ENCOUNTER — Ambulatory Visit
Admission: RE | Admit: 2023-04-27 | Discharge: 2023-04-27 | Disposition: A | Discharge status: Home / Self Care | Payer: BC Managed Care – PPO | Source: Ambulatory Visit | Attending: Nurse Practitioner | Admitting: Nurse Practitioner

## 2023-04-27 DIAGNOSIS — R928 Other abnormal and inconclusive findings on diagnostic imaging of breast: Secondary | ICD-10-CM | POA: Insufficient documentation

## 2023-04-27 DIAGNOSIS — N6489 Other specified disorders of breast: Secondary | ICD-10-CM | POA: Insufficient documentation

## 2023-04-27 DIAGNOSIS — N6082 Other benign mammary dysplasias of left breast: Secondary | ICD-10-CM | POA: Diagnosis not present

## 2023-04-27 HISTORY — PX: BREAST BIOPSY: SHX20

## 2023-04-27 MED ORDER — LIDOCAINE HCL 1 % IJ SOLN
15.0000 mL | Freq: Once | INTRAMUSCULAR | Status: AC
  Administered 2023-04-27: 15 mL
  Filled 2023-04-27: qty 15

## 2023-04-27 MED ORDER — LIDOCAINE-EPINEPHRINE 1 %-1:100000 IJ SOLN
10.0000 mL | Freq: Once | INTRAMUSCULAR | Status: AC
  Administered 2023-04-27: 10 mL
  Filled 2023-04-27: qty 10

## 2023-04-28 DIAGNOSIS — E538 Deficiency of other specified B group vitamins: Secondary | ICD-10-CM | POA: Diagnosis not present

## 2023-04-28 DIAGNOSIS — E782 Mixed hyperlipidemia: Secondary | ICD-10-CM | POA: Diagnosis not present

## 2023-04-28 DIAGNOSIS — E559 Vitamin D deficiency, unspecified: Secondary | ICD-10-CM | POA: Diagnosis not present

## 2023-04-28 DIAGNOSIS — R7301 Impaired fasting glucose: Secondary | ICD-10-CM | POA: Diagnosis not present

## 2023-04-28 DIAGNOSIS — I10 Essential (primary) hypertension: Secondary | ICD-10-CM | POA: Diagnosis not present

## 2023-04-28 LAB — SURGICAL PATHOLOGY

## 2023-04-29 LAB — CBC WITH DIFFERENTIAL/PLATELET
Basophils Absolute: 0.1 10*3/uL (ref 0.0–0.2)
Basos: 1 %
EOS (ABSOLUTE): 0.3 10*3/uL (ref 0.0–0.4)
Eos: 3 %
Hematocrit: 42.9 % (ref 34.0–46.6)
Hemoglobin: 14.3 g/dL (ref 11.1–15.9)
Immature Grans (Abs): 0.1 10*3/uL (ref 0.0–0.1)
Immature Granulocytes: 1 %
Lymphocytes Absolute: 2.8 10*3/uL (ref 0.7–3.1)
Lymphs: 25 %
MCH: 30 pg (ref 26.6–33.0)
MCHC: 33.3 g/dL (ref 31.5–35.7)
MCV: 90 fL (ref 79–97)
Monocytes Absolute: 0.7 10*3/uL (ref 0.1–0.9)
Monocytes: 6 %
Neutrophils Absolute: 7.2 10*3/uL — ABNORMAL HIGH (ref 1.4–7.0)
Neutrophils: 64 %
Platelets: 421 10*3/uL (ref 150–450)
RBC: 4.77 x10E6/uL (ref 3.77–5.28)
RDW: 13.8 % (ref 11.7–15.4)
WBC: 11.2 10*3/uL — ABNORMAL HIGH (ref 3.4–10.8)

## 2023-04-29 LAB — LIPID PANEL
Chol/HDL Ratio: 8.7 ratio — ABNORMAL HIGH (ref 0.0–4.4)
Cholesterol, Total: 253 mg/dL — ABNORMAL HIGH (ref 100–199)
HDL: 29 mg/dL — ABNORMAL LOW (ref >39)
LDL Chol Calc (NIH): 174 mg/dL — ABNORMAL HIGH (ref 0–99)
Triglycerides: 259 mg/dL — ABNORMAL HIGH (ref 0–149)
VLDL Cholesterol Cal: 50 mg/dL — ABNORMAL HIGH (ref 5–40)

## 2023-04-29 LAB — CMP14+EGFR
ALT: 16 IU/L (ref 0–32)
AST: 12 IU/L (ref 0–40)
Albumin/Globulin Ratio: 1.6 (ref 1.2–2.2)
Albumin: 4.3 g/dL (ref 3.9–4.9)
Alkaline Phosphatase: 78 IU/L (ref 44–121)
BUN/Creatinine Ratio: 11 (ref 9–23)
BUN: 9 mg/dL (ref 6–24)
Bilirubin Total: 0.5 mg/dL (ref 0.0–1.2)
CO2: 22 mmol/L (ref 20–29)
Calcium: 9.3 mg/dL (ref 8.7–10.2)
Chloride: 98 mmol/L (ref 96–106)
Creatinine, Ser: 0.79 mg/dL (ref 0.57–1.00)
Globulin, Total: 2.7 g/dL (ref 1.5–4.5)
Glucose: 104 mg/dL — ABNORMAL HIGH (ref 70–99)
Potassium: 4.5 mmol/L (ref 3.5–5.2)
Sodium: 135 mmol/L (ref 134–144)
Total Protein: 7 g/dL (ref 6.0–8.5)
eGFR: 95 mL/min/{1.73_m2} (ref >59)

## 2023-04-29 LAB — VITAMIN D 25 HYDROXY (VIT D DEFICIENCY, FRACTURES): Vit D, 25-Hydroxy: 15.5 ng/mL — ABNORMAL LOW (ref 30.0–100.0)

## 2023-04-29 LAB — B12 AND FOLATE PANEL
Folate: 3.1 ng/mL (ref >3.0)
Vitamin B-12: 403 pg/mL (ref 232–1245)

## 2023-04-29 LAB — HGB A1C W/O EAG: Hgb A1c MFr Bld: 6.3 % — ABNORMAL HIGH (ref 4.8–5.6)

## 2023-05-05 ENCOUNTER — Encounter: Payer: Self-pay | Admitting: Nurse Practitioner

## 2023-05-05 ENCOUNTER — Ambulatory Visit (INDEPENDENT_AMBULATORY_CARE_PROVIDER_SITE_OTHER): Payer: BC Managed Care – PPO | Admitting: Nurse Practitioner

## 2023-05-05 VITALS — BP 120/78 | HR 92 | Temp 97.4°F | Resp 16 | Ht 66.0 in | Wt 259.6 lb

## 2023-05-05 DIAGNOSIS — E559 Vitamin D deficiency, unspecified: Secondary | ICD-10-CM | POA: Diagnosis not present

## 2023-05-05 DIAGNOSIS — M7701 Medial epicondylitis, right elbow: Secondary | ICD-10-CM

## 2023-05-05 DIAGNOSIS — Z0001 Encounter for general adult medical examination with abnormal findings: Secondary | ICD-10-CM | POA: Diagnosis not present

## 2023-05-05 DIAGNOSIS — Z6841 Body Mass Index (BMI) 40.0 and over, adult: Secondary | ICD-10-CM

## 2023-05-05 DIAGNOSIS — Z1151 Encounter for screening for human papillomavirus (HPV): Secondary | ICD-10-CM

## 2023-05-05 DIAGNOSIS — Z113 Encounter for screening for infections with a predominantly sexual mode of transmission: Secondary | ICD-10-CM | POA: Diagnosis not present

## 2023-05-05 DIAGNOSIS — R300 Dysuria: Secondary | ICD-10-CM | POA: Diagnosis not present

## 2023-05-05 DIAGNOSIS — E782 Mixed hyperlipidemia: Secondary | ICD-10-CM

## 2023-05-05 DIAGNOSIS — Z124 Encounter for screening for malignant neoplasm of cervix: Secondary | ICD-10-CM

## 2023-05-05 DIAGNOSIS — R635 Abnormal weight gain: Secondary | ICD-10-CM | POA: Diagnosis not present

## 2023-05-05 MED ORDER — VITAMIN D (ERGOCALCIFEROL) 1.25 MG (50000 UNIT) PO CAPS: 50000.0000 [IU] | ORAL_CAPSULE | ORAL | 1 refills | Status: DC

## 2023-05-05 MED ORDER — PHENTERMINE HCL 37.5 MG PO TABS: 37.5000 mg | ORAL_TABLET | Freq: Every day | ORAL | 1 refills | Status: DC

## 2023-05-05 MED ORDER — CYCLOBENZAPRINE HCL 10 MG PO TABS: 10.0000 mg | ORAL_TABLET | Freq: Every evening | ORAL | 1 refills | Status: DC | PRN

## 2023-05-05 MED ORDER — ROSUVASTATIN CALCIUM 5 MG PO TABS: 5.0000 mg | ORAL_TABLET | Freq: Every day | ORAL | 2 refills | Status: DC

## 2023-05-05 MED ORDER — TOPIRAMATE 25 MG PO CPSP: 25.0000 mg | ORAL_CAPSULE | Freq: Two times a day (BID) | ORAL | 3 refills | Status: DC

## 2023-05-05 NOTE — Progress Notes (Signed)
[facility] [address] [place], [place] [address]  Internal MEDICINE  Office Visit Note  Patient Name: Yvonne Davis  [id number]  [id number]  Date of Service: 05/05/2023  Chief Complaint  Patient presents with   Hypertension   Annual Exam    HPI [name] presents for an annual well visit and physical exam.  Well-appearing 42 y.o. female with hypertension and anxiety. Routine mammogram: done on may 2, breast biopsy done on 04/27/23. It was benign Pap smear: due today Labs: discussed lab results with patient today New or worsening pain: none Metformin -- causes nausea, vomiting and diarrhea, not tolerated.  Phentermine helped with weight loss previously  A1c 6.3 -- prediabetic, FBG is 104 Cholesterol panel is significantly elevated  The 10-year ASCVD risk score (Arnett DK, et al., 2019) is: 13.1%   Values used to calculate the score:     Age: 71 years     Sex: Female     Is Non-Hispanic African American: No     Diabetic: No     Tobacco smoker: Yes     Systolic Blood Pressure: 120 mmHg     Is BP treated: Yes     HDL Cholesterol: 29 mg/dL     Total Cholesterol: 253 mg/dL  CBC is normal, O13 is low normal, folate is low normal, vitamin D is significantly low at 15.1   Current Medication: Outpatient Encounter Medications as of 05/05/2023  Medication Sig   escitalopram (LEXAPRO) 5 MG tablet Take 1 tablet (5 mg total) by mouth daily.   phentermine (ADIPEX-P) 37.5 MG tablet Take 1 tablet (37.5 mg total) by mouth daily before breakfast.   rosuvastatin (CRESTOR) 5 MG tablet Take 1 tablet (5 mg total) by mouth daily.   telmisartan-hydrochlorothiazide (MICARDIS HCT) 80-25 MG tablet TAKE 1 TABLET BY MOUTH DAILY   topiramate (TOPAMAX) 25 MG capsule Take 1 capsule (25 mg total) by mouth 2 (two) times daily.   Vitamin D, Ergocalciferol, (DRISDOL) 1.25 MG (50000 UNIT) CAPS capsule Take 1 capsule (50,000 Units total) by mouth every 7 (seven) days.   [DISCONTINUED]  cyclobenzaprine (FLEXERIL) 10 MG tablet Take 1 tablet (10 mg total) by mouth at bedtime as needed for muscle spasms.   cyclobenzaprine (FLEXERIL) 10 MG tablet Take 1 tablet (10 mg total) by mouth at bedtime as needed for muscle spasms.   No facility-administered encounter medications on file as of 05/05/2023.    Surgical History: Past Surgical History:  Procedure Laterality Date   ABDOMINAL HYSTERECTOMY     BREAST BIOPSY Left 03/07/2019   left bc lymp node US bx  LYMPH NODE NEGATIVE FOR MORPHOLOGIC FEATURES OF A NEOPLASTIC PROCESS.   BREAST BIOPSY Left 04/27/2023   Stereo bx, Coil Clip, Path pending   BREAST BIOPSY Left 04/27/2023   MM LT BREAST BX W LOC DEV 1ST LESION IMAGE BX SPEC STEREO GUIDE 04/27/2023 [facility]    Medical History: Past Medical History:  Diagnosis Date   Heart palpitations    Hypertension     Family History: Family History  Problem Relation Age of Onset   Cancer Father    Breast cancer Maternal Uncle 25   Breast cancer Maternal Grandmother 75    Social History   Socioeconomic History   Marital status: Single    Spouse name: Not on file   Number of children: Not on file   Years of education: Not on file   Highest education level: Not on file  Occupational History   Not on file  Tobacco Use   Smoking status: Every Day    Packs/day: 1    Types: Cigarettes   Smokeless tobacco: Never  Substance and Sexual Activity   Alcohol use: No   Drug use: Never   Sexual activity: Yes  Other Topics Concern   Not on file  Social History Narrative   Not on file   Social Determinants of Health   Financial Resource Strain: Not on file  Food Insecurity: Not on file  Transportation Needs: Not on file  Physical Activity: Not on file  Stress: Not on file  Social Connections: Not on file  Intimate Partner Violence: Not on file      Review of Systems  Constitutional:  Positive for fatigue and unexpected weight change. Negative for activity change,  appetite change, chills and fever.  HENT:  Positive for postnasal drip. Negative for congestion, ear pain, rhinorrhea, sneezing, sore throat and trouble swallowing.   Eyes: Negative.  Negative for redness.  Respiratory: Negative.  Negative for cough, chest tightness, shortness of breath and wheezing.   Cardiovascular: Negative.  Negative for chest pain and palpitations.  Gastrointestinal: Negative.  Negative for abdominal pain, blood in stool, constipation, diarrhea, nausea and vomiting.  Endocrine: Positive for polyphagia.  Genitourinary: Negative.  Negative for difficulty urinating, dysuria, frequency, hematuria and urgency.  Musculoskeletal:  Positive for arthralgias (right elbow, right hand). Negative for back pain, joint swelling, myalgias and neck pain.       Feet hurt  Skin: Negative.  Negative for rash and wound.  Allergic/Immunologic: Negative.  Negative for immunocompromised state.  Neurological: Negative.  Negative for dizziness, tremors, seizures, numbness and headaches.  Hematological: Negative.   Psychiatric/Behavioral: Negative.  Negative for behavioral problems (Depression), self-injury, sleep disturbance and suicidal ideas. The patient is not nervous/anxious.     Vital Signs: BP 120/78   Pulse 92   Temp (!) 97.4 F (36.3 C)   Resp 16   Ht 5\' 6"  (1.676 m)   Wt 259 lb 9.6 oz (117.8 kg)   SpO2 95%   BMI 41.90 kg/m    Physical Exam Vitals reviewed.  Constitutional:      General: She is not in acute distress.    Appearance: Normal appearance. She is well-developed. She is obese. She is not ill-appearing or diaphoretic.  HENT:     Head: Normocephalic and atraumatic.     Right Ear: Tympanic membrane, ear canal and external ear normal.     Left Ear: Tympanic membrane, ear canal and external ear normal.     Nose: Nose normal. No congestion or rhinorrhea.     Mouth/Throat:     Mouth: Mucous membranes are moist.     Pharynx: Oropharynx is clear. No oropharyngeal  exudate or posterior oropharyngeal erythema.  Eyes:     General: No scleral icterus.       Right eye: No discharge.        Left eye: No discharge.     Conjunctiva/sclera: Conjunctivae normal.     Pupils: Pupils are equal, round, and reactive to light.  Neck:     Thyroid: No thyromegaly.     Vascular: No JVD.     Trachea: No tracheal deviation.  Cardiovascular:     Rate and Rhythm: Normal rate and regular rhythm.     Pulses: Normal pulses.     Heart sounds: Normal heart sounds. No murmur heard.    No friction rub. No gallop.  Pulmonary:     Effort: Pulmonary effort is normal.  No respiratory distress.     Breath sounds: Normal breath sounds. No stridor. No wheezing or rales.  Chest:     Chest wall: No tenderness.  Abdominal:     General: Bowel sounds are normal. There is no distension.     Palpations: Abdomen is soft. There is no mass.     Tenderness: There is no abdominal tenderness. There is no guarding or rebound.     Hernia: There is no hernia in the left inguinal area or right inguinal area.  Genitourinary:    General: Normal vulva.     Exam position: Lithotomy position.     Pubic Area: No rash or pubic lice.      Labia:        Right: No rash, tenderness, lesion or injury.        Left: No rash, tenderness, lesion or injury.      Urethra: No prolapse, urethral swelling or urethral lesion.     Vagina: Normal. No signs of injury and foreign body. No vaginal discharge, erythema, tenderness, bleeding, lesions or prolapsed vaginal walls.     Cervix: No cervical motion tenderness, discharge, friability, lesion, erythema, cervical bleeding or eversion.     Uterus: Normal. Not deviated, not enlarged, not fixed, not tender and no uterine prolapse.      Adnexa: Right adnexa normal.       Right: No mass, tenderness or fullness.         Left: No mass, tenderness or fullness.       Rectum: Normal. No mass, anal fissure or external hemorrhoid.  Musculoskeletal:        General: No  tenderness or deformity. Normal range of motion.     Cervical back: Normal range of motion and neck supple.     Right lower leg: No edema.     Left lower leg: No edema.  Lymphadenopathy:     Cervical: No cervical adenopathy.     Lower Body: No right inguinal adenopathy. No left inguinal adenopathy.  Skin:    General: Skin is warm and dry.     Capillary Refill: Capillary refill takes less than 2 seconds.     Coloration: Skin is not pale.     Findings: No erythema or rash.  Neurological:     Mental Status: She is alert and oriented to person, place, and time.     Cranial Nerves: No cranial nerve deficit.     Motor: No abnormal muscle tone.     Coordination: Coordination normal.     Gait: Gait normal.     Deep Tendon Reflexes: Reflexes are normal and symmetric.  Psychiatric:        Mood and Affect: Mood normal.        Behavior: Behavior normal.        Thought Content: Thought content normal.        Judgment: Judgment normal.        Assessment/Plan: 1. Encounter for routine adult health examination with abnormal findings Age-appropriate preventive screenings and vaccinations discussed, annual physical exam completed. Routine labs for health maintenance results discussed with patient today. PHM updated.   2. Epicondylitis elbow, medial, right Continue cyclobenzaprine as prescribed.  - cyclobenzaprine (FLEXERIL) 10 MG tablet; Take 1 tablet (10 mg total) by mouth at bedtime as needed for muscle spasms.  Dispense: 90 tablet; Refill: 1  3. Abnormal weight gain Start phentermine and topiramate as prescribed, follow up in 8 weeks  - phentermine (ADIPEX-P) 37.5 MG tablet; Take  1 tablet (37.5 mg total) by mouth daily before breakfast.  Dispense: 30 tablet; Refill: 1 - topiramate (TOPAMAX) 25 MG capsule; Take 1 capsule (25 mg total) by mouth 2 (two) times daily.  Dispense: 60 capsule; Refill: 3  4. Vitamin D deficiency Start weekly vitamin D supplement, follow up in 8 weeks  - Vitamin  D, Ergocalciferol, (DRISDOL) 1.25 MG (50000 UNIT) CAPS capsule; Take 1 capsule (50,000 Units total) by mouth every 7 (seven) days.  Dispense: 12 capsule; Refill: 1  5. Mixed hyperlipidemia Start rosuvastatin as prescribed, follow up in 8 weeks  - rosuvastatin (CRESTOR) 5 MG tablet; Take 1 tablet (5 mg total) by mouth daily.  Dispense: 30 tablet; Refill: 2  6. Dysuria Routine urinalysis done - UA/M w/rflx Culture, Routine - Microscopic Examination  7. Class 3 severe obesity due to excess calories with serious comorbidity and body mass index (BMI) of 40.0 to 44.9 in adult [facility]) Phentermine and topiramate prescribed for weight loss.  - phentermine (ADIPEX-P) 37.5 MG tablet; Take 1 tablet (37.5 mg total) by mouth daily before breakfast.  Dispense: 30 tablet; Refill: 1 - topiramate (TOPAMAX) 25 MG capsule; Take 1 capsule (25 mg total) by mouth 2 (two) times daily.  Dispense: 60 capsule; Refill: 3  8. Screen for sexually transmitted diseases Routine cervical swab done  - NuSwab Vaginitis Plus (VG+)  9. Routine cervical smear Pap smear done with normal pelvic exam - IGP, Aptima HPV      General Counseling: Matayah verbalizes understanding of the findings of todays visit and agrees with plan of treatment. I have discussed any further diagnostic evaluation that may be needed or ordered today. We also reviewed her medications today. she has been encouraged to call the office with any questions or concerns that should arise related to todays visit.    Orders Placed This Encounter  Procedures   UA/M w/rflx Culture, Routine    Meds ordered this encounter  Medications   cyclobenzaprine (FLEXERIL) 10 MG tablet    Sig: Take 1 tablet (10 mg total) by mouth at bedtime as needed for muscle spasms.    Dispense:  90 tablet    Refill:  1   Vitamin D, Ergocalciferol, (DRISDOL) 1.25 MG (50000 UNIT) CAPS capsule    Sig: Take 1 capsule (50,000 Units total) by mouth every 7 (seven) days.     Dispense:  12 capsule    Refill:  1   rosuvastatin (CRESTOR) 5 MG tablet    Sig: Take 1 tablet (5 mg total) by mouth daily.    Dispense:  30 tablet    Refill:  2   phentermine (ADIPEX-P) 37.5 MG tablet    Sig: Take 1 tablet (37.5 mg total) by mouth daily before breakfast.    Dispense:  30 tablet    Refill:  1    Please do not run through insurance, patient will have goodrx card.   topiramate (TOPAMAX) 25 MG capsule    Sig: Take 1 capsule (25 mg total) by mouth 2 (two) times daily.    Dispense:  60 capsule    Refill:  3    Return in about 8 weeks (around 06/30/2023) for F/U, eval new med, Osmar Howton PCP.   Total time spent:30 Minutes Time spent includes review of chart, medications, test results, and follow up plan with the patient.   [place] Controlled Substance Database was reviewed by me.  This patient was seen by Sallyanne Kuster, FNP-C in collaboration with Dr. Beverely Risen as a part  of collaborative care agreement.  Maylynn Orzechowski R. Tedd Sias, MSN, FNP-C Internal medicine

## 2023-05-06 LAB — UA/M W/RFLX CULTURE, ROUTINE
Bilirubin, UA: NEGATIVE
Glucose, UA: NEGATIVE
Ketones, UA: NEGATIVE
Leukocytes,UA: NEGATIVE
Nitrite, UA: NEGATIVE
Protein,UA: NEGATIVE
RBC, UA: NEGATIVE
Specific Gravity, UA: 1.016 (ref 1.005–1.030)
Urobilinogen, Ur: 0.2 mg/dL (ref 0.2–1.0)
pH, UA: 6 (ref 5.0–7.5)

## 2023-05-06 LAB — MICROSCOPIC EXAMINATION
Casts: NONE SEEN /lpf
Epithelial Cells (non renal): >10 /hpf — AB (ref 0–10)
RBC, Urine: NONE SEEN /hpf (ref 0–2)
WBC, UA: NONE SEEN /hpf (ref 0–5)

## 2023-05-07 ENCOUNTER — Encounter: Payer: Self-pay | Admitting: Nurse Practitioner

## 2023-05-08 LAB — NUSWAB VAGINITIS PLUS (VG+)
Candida albicans, NAA: NEGATIVE
Candida glabrata, NAA: NEGATIVE
Chlamydia trachomatis, NAA: NEGATIVE
Megasphaera 1: HIGH Score — AB
Neisseria gonorrhoeae, NAA: NEGATIVE
Trich vag by NAA: NEGATIVE

## 2023-05-12 ENCOUNTER — Telehealth: Payer: Self-pay

## 2023-05-12 LAB — IGP, APTIMA HPV: HPV Aptima: NEGATIVE

## 2023-05-12 NOTE — Telephone Encounter (Signed)
Patient notified

## 2023-05-12 NOTE — Progress Notes (Signed)
Please let patient know that her pap smear was normal and negative for HPV and her nuswab was negative for STDs, yeast and BV. Repeat pap smear in 3-5 years.

## 2023-05-12 NOTE — Telephone Encounter (Signed)
-----   Message from Sallyanne Kuster, NP sent at 05/12/2023  7:57 AM EDT ----- Please let patient know that her pap smear was normal and negative for HPV and her nuswab was negative for STDs, yeast and BV. Repeat pap smear in 3-5 years.

## 2023-06-09 ENCOUNTER — Other Ambulatory Visit: Payer: Self-pay

## 2023-06-09 MED ORDER — TELMISARTAN-HCTZ 80-25 MG PO TABS: 1.0000 | ORAL_TABLET | Freq: Every day | ORAL | 6 refills | Status: DC

## 2023-06-30 ENCOUNTER — Ambulatory Visit (INDEPENDENT_AMBULATORY_CARE_PROVIDER_SITE_OTHER): Payer: BC Managed Care – PPO | Admitting: Nurse Practitioner

## 2023-06-30 ENCOUNTER — Encounter: Payer: Self-pay | Admitting: Nurse Practitioner

## 2023-06-30 VITALS — BP 130/70 | HR 100 | Temp 98.4°F | Resp 16 | Ht 66.0 in | Wt 244.8 lb

## 2023-06-30 DIAGNOSIS — R635 Abnormal weight gain: Secondary | ICD-10-CM

## 2023-06-30 DIAGNOSIS — E782 Mixed hyperlipidemia: Secondary | ICD-10-CM | POA: Diagnosis not present

## 2023-06-30 DIAGNOSIS — G5793 Unspecified mononeuropathy of bilateral lower limbs: Secondary | ICD-10-CM | POA: Diagnosis not present

## 2023-06-30 DIAGNOSIS — Z6841 Body Mass Index (BMI) 40.0 and over, adult: Secondary | ICD-10-CM

## 2023-06-30 MED ORDER — PHENTERMINE HCL 37.5 MG PO TABS: 37.5000 mg | ORAL_TABLET | Freq: Every day | ORAL | 1 refills | Status: DC

## 2023-06-30 MED ORDER — PRAVASTATIN SODIUM 10 MG PO TABS: 10.0000 mg | ORAL_TABLET | Freq: Every day | ORAL | 3 refills | Status: DC

## 2023-06-30 NOTE — Progress Notes (Signed)
[facility] [address] [place], [place] [address]  Internal MEDICINE  Office Visit Note  Patient Name: Yvonne Davis  [id number]  [id number]  Date of Service: 06/30/2023  Chief Complaint  Patient presents with   Hypertension   Follow-up    Eval new med.     HPI Yvonne Davis presents for a follow-up visit for weight loss, constipation and high cholesterol.  Weight loss -- good job! Lost 15 lbs so far. Topiramate is decreasing sweet cravings.  Constipation -- has tried linzess, requesting sample, not on formulary for her insurance.  Rosuvastatin is causing considerable joint/muscle pains.     Current Medication: Outpatient Encounter Medications as of 06/30/2023  Medication Sig   cyclobenzaprine (FLEXERIL) 10 MG tablet Take 1 tablet (10 mg total) by mouth at bedtime as needed for muscle spasms.   escitalopram (LEXAPRO) 5 MG tablet Take 1 tablet (5 mg total) by mouth daily.   pravastatin (PRAVACHOL) 10 MG tablet Take 1 tablet (10 mg total) by mouth daily.   telmisartan-hydrochlorothiazide (MICARDIS HCT) 80-25 MG tablet Take 1 tablet by mouth daily.   topiramate (TOPAMAX) 25 MG capsule Take 1 capsule (25 mg total) by mouth 2 (two) times daily.   Vitamin D, Ergocalciferol, (DRISDOL) 1.25 MG (50000 UNIT) CAPS capsule Take 1 capsule (50,000 Units total) by mouth every 7 (seven) days.   [DISCONTINUED] phentermine (ADIPEX-P) 37.5 MG tablet Take 1 tablet (37.5 mg total) by mouth daily before breakfast.   [DISCONTINUED] rosuvastatin (CRESTOR) 5 MG tablet Take 1 tablet (5 mg total) by mouth daily.   phentermine (ADIPEX-P) 37.5 MG tablet Take 1 tablet (37.5 mg total) by mouth daily before breakfast.   No facility-administered encounter medications on file as of 06/30/2023.    Surgical History: Past Surgical History:  Procedure Laterality Date   ABDOMINAL HYSTERECTOMY     BREAST BIOPSY Left 03/07/2019   left bc lymp node US bx  LYMPH NODE NEGATIVE FOR MORPHOLOGIC FEATURES OF A  NEOPLASTIC PROCESS.   BREAST BIOPSY Left 04/27/2023   Stereo bx, Coil Clip, Path pending   BREAST BIOPSY Left 04/27/2023   MM LT BREAST BX W LOC DEV 1ST LESION IMAGE BX SPEC STEREO GUIDE 04/27/2023 [facility]    Medical History: Past Medical History:  Diagnosis Date   Heart palpitations    Hypertension     Family History: Family History  Problem Relation Age of Onset   Cancer Father    Breast cancer Maternal Uncle 55   Breast cancer Maternal Grandmother 24    Social History   Socioeconomic History   Marital status: Single    Spouse name: Not on file   Number of children: Not on file   Years of education: Not on file   Highest education level: Not on file  Occupational History   Not on file  Tobacco Use   Smoking status: Every Day    Packs/day: 1    Types: Cigarettes   Smokeless tobacco: Never  Substance and Sexual Activity   Alcohol use: No   Drug use: Never   Sexual activity: Yes  Other Topics Concern   Not on file  Social History Narrative   Not on file   Social Determinants of Health   Financial Resource Strain: Not on file  Food Insecurity: Not on file  Transportation Needs: Not on file  Physical Activity: Not on file  Stress: Not on file  Social Connections: Not on file  Intimate Partner Violence: Not on file  Review of Systems  Constitutional:  Positive for fatigue and unexpected weight change. Negative for chills.  HENT:  Negative for congestion, postnasal drip, rhinorrhea, sneezing and sore throat.   Eyes:  Negative for redness.  Respiratory: Negative.  Negative for cough, chest tightness, shortness of breath and wheezing.   Cardiovascular: Negative.  Negative for chest pain and palpitations.  Gastrointestinal: Negative.  Negative for abdominal pain, constipation, diarrhea, nausea and vomiting.  Endocrine: Negative for polyphagia.  Genitourinary:  Negative for dysuria and frequency.  Musculoskeletal:  Positive for arthralgias (right  elbow, right hand). Negative for back pain, joint swelling and neck pain.       Feet hurt  Skin:  Negative for rash.  Neurological: Negative.  Negative for tremors and numbness.  Psychiatric/Behavioral:  Negative for behavioral problems (Depression), sleep disturbance and suicidal ideas. The patient is not nervous/anxious.     Vital Signs: BP 130/70   Pulse 100   Temp 98.4 F (36.9 C)   Resp 16   Ht 5\' 6"  (1.676 m)   Wt 244 lb 12.8 oz (111 kg)   SpO2 99%   BMI 39.51 kg/m    Physical Exam Vitals reviewed.  Constitutional:      General: She is not in acute distress.    Appearance: Normal appearance. She is obese. She is not ill-appearing.  HENT:     Head: Normocephalic and atraumatic.  Eyes:     Pupils: Pupils are equal, round, and reactive to light.  Cardiovascular:     Rate and Rhythm: Normal rate and regular rhythm.  Pulmonary:     Effort: Pulmonary effort is normal. No respiratory distress.  Neurological:     Mental Status: She is alert and oriented to person, place, and time.  Psychiatric:        Mood and Affect: Mood normal.        Behavior: Behavior normal.        Assessment/Plan: 1. Neuropathy of both feet Referred to podiatry for further evaluation.  - Ambulatory referral to Podiatry  2. Mixed hyperlipidemia Discontinue rosuvastatin. Start pravastatin 10 mg daily instead.  - pravastatin (PRAVACHOL) 10 MG tablet; Take 1 tablet (10 mg total) by mouth daily.  Dispense: 30 tablet; Refill: 3  3. Abnormal weight gain Continue phentermine and topiramate as prescribed. Follow up in 8 weeks.  - phentermine (ADIPEX-P) 37.5 MG tablet; Take 1 tablet (37.5 mg total) by mouth daily before breakfast.  Dispense: 30 tablet; Refill: 1  4. Class 3 severe obesity due to excess calories with serious comorbidity and body mass index (BMI) of 40.0 to 44.9 in adult [facility]) Continue phentermine and topiramate as prescribed. Follow up in 8 weeks - phentermine (ADIPEX-P) 37.5 MG  tablet; Take 1 tablet (37.5 mg total) by mouth daily before breakfast.  Dispense: 30 tablet; Refill: 1   General Counseling: [name] verbalizes understanding of the findings of todays visit and agrees with plan of treatment. I have discussed any further diagnostic evaluation that may be needed or ordered today. We also reviewed her medications today. she has been encouraged to call the office with any questions or concerns that should arise related to todays visit.    Orders Placed This Encounter  Procedures   Ambulatory referral to Podiatry    Meds ordered this encounter  Medications   pravastatin (PRAVACHOL) 10 MG tablet    Sig: Take 1 tablet (10 mg total) by mouth daily.    Dispense:  30 tablet    Refill:  3  Discontinue rosuvastatin and fill new script asap.   phentermine (ADIPEX-P) 37.5 MG tablet    Sig: Take 1 tablet (37.5 mg total) by mouth daily before breakfast.    Dispense:  30 tablet    Refill:  1    Please do not run through insurance, patient will have goodrx card.    Return in about 7 weeks (around 08/18/2023) for F/U, Weight loss, Ruble Buttler PCP.   Total time spent:30 Minutes Time spent includes review of chart, medications, test results, and follow up plan with the patient.   [place] Controlled Substance Database was reviewed by me.  This patient was seen by Sallyanne Kuster, FNP-C in collaboration with Dr. Beverely Risen as a part of collaborative care agreement.   Shauna Bodkins R. Tedd Sias, MSN, FNP-C Internal medicine

## 2023-07-03 ENCOUNTER — Encounter: Payer: Self-pay | Admitting: Nurse Practitioner

## 2023-07-20 ENCOUNTER — Ambulatory Visit (INDEPENDENT_AMBULATORY_CARE_PROVIDER_SITE_OTHER): Payer: BC Managed Care – PPO | Admitting: Podiatry

## 2023-07-20 DIAGNOSIS — R202 Paresthesia of skin: Secondary | ICD-10-CM

## 2023-07-20 DIAGNOSIS — Z8739 Personal history of other diseases of the musculoskeletal system and connective tissue: Secondary | ICD-10-CM

## 2023-07-20 DIAGNOSIS — Q6672 Congenital pes cavus, left foot: Secondary | ICD-10-CM

## 2023-07-20 DIAGNOSIS — Q6671 Congenital pes cavus, right foot: Secondary | ICD-10-CM | POA: Diagnosis not present

## 2023-07-20 DIAGNOSIS — R200 Anesthesia of skin: Secondary | ICD-10-CM

## 2023-07-20 DIAGNOSIS — Q667 Congenital pes cavus, unspecified foot: Secondary | ICD-10-CM

## 2023-07-20 MED ORDER — GABAPENTIN 100 MG PO CAPS: 100.0000 mg | ORAL_CAPSULE | Freq: Three times a day (TID) | ORAL | 3 refills | Status: DC

## 2023-07-20 NOTE — Progress Notes (Signed)
Subjective:  Patient ID: Yvonne Davis, female    DOB: [birth year],  MRN: [id number]  Chief Complaint  Patient presents with   Numbness    Pt stated that she has a lot of numbness and tingling and at times it feels like her feet are burning     42 y.o. female presents with the above complaint.  Patient presents with bilateral high arch foot structure with history of neuropathy.  She states the numbness tingling especially while she is on her foot.  Her feet are burning left is worse than right side.  She has not seen MRIs prior to seeing me.  She does not wear any orthotics she wears good plantar shoes.  She has a history of lower back pain   Review of Systems: Negative except as noted in the HPI. Denies N/V/F/Ch.  Past Medical History:  Diagnosis Date   Heart palpitations    Hypertension     Current Outpatient Medications:    gabapentin (NEURONTIN) 100 MG capsule, Take 1 capsule (100 mg total) by mouth 3 (three) times daily., Disp: 90 capsule, Rfl: 3   cyclobenzaprine (FLEXERIL) 10 MG tablet, Take 1 tablet (10 mg total) by mouth at bedtime as needed for muscle spasms., Disp: 90 tablet, Rfl: 1   escitalopram (LEXAPRO) 5 MG tablet, Take 1 tablet (5 mg total) by mouth daily., Disp: 30 tablet, Rfl: 3   phentermine (ADIPEX-P) 37.5 MG tablet, Take 1 tablet (37.5 mg total) by mouth daily before breakfast., Disp: 30 tablet, Rfl: 1   pravastatin (PRAVACHOL) 10 MG tablet, Take 1 tablet (10 mg total) by mouth daily., Disp: 30 tablet, Rfl: 3   telmisartan-hydrochlorothiazide (MICARDIS HCT) 80-25 MG tablet, Take 1 tablet by mouth daily., Disp: 30 tablet, Rfl: 6   topiramate (TOPAMAX) 25 MG capsule, Take 1 capsule (25 mg total) by mouth 2 (two) times daily., Disp: 60 capsule, Rfl: 3   Vitamin D, Ergocalciferol, (DRISDOL) 1.25 MG (50000 UNIT) CAPS capsule, Take 1 capsule (50,000 Units total) by mouth every 7 (seven) days., Disp: 12 capsule, Rfl: 1  Social History   Tobacco Use  Smoking Status  Every Day   Current packs/day: 1.00   Types: Cigarettes  Smokeless Tobacco Never    Allergies  Allergen Reactions   Ace Inhibitors Swelling   Objective:  There were no vitals filed for this visit. There is no height or weight on file to calculate BMI. Constitutional Well developed. Well nourished.  Vascular Dorsalis pedis pulses palpable bilaterally. Posterior tibial pulses palpable bilaterally. Capillary refill normal to all digits.  No cyanosis or clubbing noted. Pedal hair growth normal.  Neurologic Normal speech. Oriented to person, place, and time. Positive for tunnel syndrome to the left side with Tinel's sign.  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  Negative to many toe signs.  This is a semireducible deformity with Coleman block test   Radiographs: None Assessment:   1. Numbness and tingling of both feet   2. History of back pain   3. Pes cavus    Plan:  Patient was evaluated and treated and all questions answered.  Numbness tingling with symptoms of tarsal tunnel syndrome and lower back pain -All questions or concerns were discussed with the patient in extensive detail given the amount of pain that she is having she will benefit from nerve conduction study to evaluate nerve  signals down to the lower extremity.  She also has a history of lower back pain with possible nerve impingement and herniated disc which could likely be causing her issues especially to the left side.  I discussed shoe gear modification as well as orthotics.  She will also benefit from gabapentin which was sent to the pharmacy  -Pes cavus -I explained to patient the etiology of pes planovalgus and relationship with arch for and various treatment options were discussed.  Given patient foot structure in the setting of arch support I believe patient will  benefit from custom-made orthotics to help control the hindfoot motion support the arch of the foot and take the stress away from plantar fascial.  Patient agrees with the plan like to proceed with orthotics -Patient was casted for orthotics

## 2023-08-10 ENCOUNTER — Telehealth: Payer: Self-pay

## 2023-08-10 NOTE — Telephone Encounter (Signed)
Patient called to report low BP reading, 87/66. Per Alyssa, patient is to cut her Micardis 80-25mg  tablet in half for now and will be seen with [place] tomorrow, 08/11/23. Advised patient to go to the ED if symptoms worsen before being seen.

## 2023-08-11 ENCOUNTER — Encounter: Payer: Self-pay | Admitting: Nurse Practitioner

## 2023-08-11 ENCOUNTER — Ambulatory Visit: Payer: BC Managed Care – PPO | Admitting: Nurse Practitioner

## 2023-08-11 VITALS — BP 110/76 | HR 95 | Temp 98.2°F | Resp 16 | Ht 66.0 in | Wt 244.8 lb

## 2023-08-11 DIAGNOSIS — Z6839 Body mass index (BMI) 39.0-39.9, adult: Secondary | ICD-10-CM

## 2023-08-11 DIAGNOSIS — I10 Essential (primary) hypertension: Secondary | ICD-10-CM | POA: Diagnosis not present

## 2023-08-11 DIAGNOSIS — R635 Abnormal weight gain: Secondary | ICD-10-CM | POA: Diagnosis not present

## 2023-08-11 DIAGNOSIS — Z9189 Other specified personal risk factors, not elsewhere classified: Secondary | ICD-10-CM | POA: Diagnosis not present

## 2023-08-11 DIAGNOSIS — Z79899 Other long term (current) drug therapy: Secondary | ICD-10-CM

## 2023-08-11 MED ORDER — TIRZEPATIDE-WEIGHT MANAGEMENT 5 MG/0.5ML ~~LOC~~ SOAJ: 5.0000 mg | SUBCUTANEOUS | 3 refills | Status: DC

## 2023-08-11 MED ORDER — TIRZEPATIDE-WEIGHT MANAGEMENT 2.5 MG/0.5ML ~~LOC~~ SOAJ: 2.5000 mg | SUBCUTANEOUS | 0 refills | Status: DC

## 2023-08-11 MED ORDER — TELMISARTAN-HCTZ 40-12.5 MG PO TABS: 1.0000 | ORAL_TABLET | Freq: Every day | ORAL | 3 refills | Status: DC

## 2023-08-11 NOTE — Progress Notes (Signed)
[facility] [address] [place], [place] [address]  Internal MEDICINE  Office Visit Note  Patient Name: Yvonne Davis  [id number]  [id number]  Date of Service: 08/11/2023  Chief Complaint  Patient presents with   Acute Visit    Low blood pressure     HPI Yvonne Davis presents for an acute sick visit for low blood pressure.  Lowest SBP was 88 yesterday.  Today, BP was 95/64 and 95/61. Was originally taking telmisartan-hydrochlorothiazide 80-25 mg. When her BP initially dropped, she was instructed to take a 1/2 tablet for the next dose but her BP was still low with the half dose. --Only taking pravastatin for cholesterol, was switch from rosuvastatin due to achy muscle and joints.   Also requesting to try zepbound for weight loss.   The 10-year ASCVD risk score (Arnett DK, et al., 2019) is: 11.1%   Values used to calculate the score:     Age: 42 years     Sex: Female     Is Non-Hispanic African American: No     Diabetic: No     Tobacco smoker: Yes     Systolic Blood Pressure: 110 mmHg     Is BP treated: Yes     HDL Cholesterol: 29 mg/dL     Total Cholesterol: 253 mg/dL     Current Medication:  Outpatient Encounter Medications as of 08/11/2023  Medication Sig   cyclobenzaprine (FLEXERIL) 10 MG tablet Take 1 tablet (10 mg total) by mouth at bedtime as needed for muscle spasms.   escitalopram (LEXAPRO) 5 MG tablet Take 1 tablet (5 mg total) by mouth daily.   gabapentin (NEURONTIN) 100 MG capsule Take 1 capsule (100 mg total) by mouth 3 (three) times daily.   pravastatin (PRAVACHOL) 10 MG tablet Take 1 tablet (10 mg total) by mouth daily.   telmisartan-hydrochlorothiazide (MICARDIS HCT) 40-12.5 MG tablet Take 1 tablet by mouth daily.   tirzepatide (ZEPBOUND) 2.5 MG/0.5ML Pen Inject 2.5 mg into the skin once a week.   tirzepatide (ZEPBOUND) 5 MG/0.5ML Pen Inject 5 mg into the skin once a week.   Vitamin D, Ergocalciferol, (DRISDOL) 1.25 MG (50000 UNIT) CAPS  capsule Take 1 capsule (50,000 Units total) by mouth every 7 (seven) days.   [DISCONTINUED] phentermine (ADIPEX-P) 37.5 MG tablet Take 1 tablet (37.5 mg total) by mouth daily before breakfast.   [DISCONTINUED] telmisartan-hydrochlorothiazide (MICARDIS HCT) 80-25 MG tablet Take 1 tablet by mouth daily.   [DISCONTINUED] topiramate (TOPAMAX) 25 MG capsule Take 1 capsule (25 mg total) by mouth 2 (two) times daily.   No facility-administered encounter medications on file as of 08/11/2023.      Medical History: Past Medical History:  Diagnosis Date   Heart palpitations    Hypertension      Vital Signs: BP 110/76   Pulse 95   Temp 98.2 F (36.8 C)   Resp 16   Ht 5\' 6"  (1.676 m)   Wt 244 lb 12.8 oz (111 kg)   SpO2 99%   BMI 39.51 kg/m    Review of Systems  Constitutional:  Positive for fatigue and unexpected weight change. Negative for chills.  HENT:  Negative for congestion, postnasal drip, rhinorrhea, sneezing and sore throat.   Eyes:  Negative for redness.  Respiratory: Negative.  Negative for cough, chest tightness, shortness of breath and wheezing.   Cardiovascular: Negative.  Negative for chest pain and palpitations.  Gastrointestinal: Negative.  Negative for abdominal pain, constipation, diarrhea, nausea and vomiting.  Endocrine:  Negative for polyphagia.  Genitourinary:  Negative for dysuria and frequency.  Musculoskeletal:  Positive for arthralgias (right elbow, right hand). Negative for back pain, joint swelling and neck pain.       Feet hurt  Skin:  Negative for rash.  Neurological:  Positive for dizziness. Negative for tremors and numbness.  Psychiatric/Behavioral:  Negative for behavioral problems (Depression), sleep disturbance and suicidal ideas. The patient is not nervous/anxious.     Physical Exam Vitals reviewed.  Constitutional:      General: She is not in acute distress.    Appearance: Normal appearance. She is obese. She is not ill-appearing.  HENT:      Head: Normocephalic and atraumatic.  Eyes:     Pupils: Pupils are equal, round, and reactive to light.  Cardiovascular:     Rate and Rhythm: Normal rate and regular rhythm.  Pulmonary:     Effort: Pulmonary effort is normal. No respiratory distress.  Neurological:     Mental Status: She is alert and oriented to person, place, and time.  Psychiatric:        Mood and Affect: Mood normal.        Behavior: Behavior normal.       Assessment/Plan: 1. Primary hypertension Micardis HCT dose decreased, follow up in 4 weeks.  - telmisartan-hydrochlorothiazide (MICARDIS HCT) 40-12.5 MG tablet; Take 1 tablet by mouth daily.  Dispense: 30 tablet; Refill: 3  2. Abnormal weight gain Zepbound prescribed.  - tirzepatide (ZEPBOUND) 2.5 MG/0.5ML Pen; Inject 2.5 mg into the skin once a week.  Dispense: 2 mL; Refill: 0 - tirzepatide (ZEPBOUND) 5 MG/0.5ML Pen; Inject 5 mg into the skin once a week.  Dispense: 6 mL; Refill: 3  3. Class 2 severe obesity due to excess calories with serious comorbidity and body mass index (BMI) of 39.0 to 39.9 in adult [facility]) Zepbound prescribed, will see if can get PA approved  - tirzepatide (ZEPBOUND) 2.5 MG/0.5ML Pen; Inject 2.5 mg into the skin once a week.  Dispense: 2 mL; Refill: 0 - tirzepatide (ZEPBOUND) 5 MG/0.5ML Pen; Inject 5 mg into the skin once a week.  Dispense: 6 mL; Refill: 3  4. At increased risk for cardiovascular disease Zepbound prescribed. Requires PA - tirzepatide (ZEPBOUND) 2.5 MG/0.5ML Pen; Inject 2.5 mg into the skin once a week.  Dispense: 2 mL; Refill: 0 - tirzepatide (ZEPBOUND) 5 MG/0.5ML Pen; Inject 5 mg into the skin once a week.  Dispense: 6 mL; Refill: 3  5. On statin therapy due to risk of future cardiovascular event Continue pravastatin as prescribed    General Counseling: Yvonne Davis verbalizes understanding of the findings of todays visit and agrees with plan of treatment. I have discussed any further diagnostic evaluation that may be  needed or ordered today. We also reviewed her medications today. she has been encouraged to call the office with any questions or concerns that should arise related to todays visit.    Counseling:    No orders of the defined types were placed in this encounter.   Meds ordered this encounter  Medications   tirzepatide (ZEPBOUND) 2.5 MG/0.5ML Pen    Sig: Inject 2.5 mg into the skin once a week.    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    Dispense:  30 tablet    Refill:  3    Discontinue the 80-25 mg tablet now and fill new script asap. Thanks    Return in about 4 weeks (around 09/08/2023) for F/U, eval new med, BP check, Darlena Koval PCP.  [place] Controlled Substance Database was reviewed by me for overdose risk score (ORS)  Time spent:30 Minutes Time spent with patient included reviewing progress notes, labs, imaging studies, and discussing plan for follow up.   This patient was seen by Sallyanne Kuster, FNP-C in collaboration with Dr. Beverely Risen as a part of collaborative care agreement.  Judaea Burgoon R. Tedd Sias, MSN, FNP-C Internal Medicine

## 2023-08-11 NOTE — Telephone Encounter (Signed)
Pt had appt today with alyssa

## 2023-08-18 ENCOUNTER — Ambulatory Visit: Payer: BC Managed Care – PPO | Admitting: Nurse Practitioner

## 2023-09-05 ENCOUNTER — Encounter: Payer: Self-pay | Admitting: Nurse Practitioner

## 2023-09-08 ENCOUNTER — Encounter: Payer: Self-pay | Admitting: Nurse Practitioner

## 2023-09-08 ENCOUNTER — Ambulatory Visit: Payer: BC Managed Care – PPO | Admitting: Nurse Practitioner

## 2023-09-08 VITALS — BP 120/78 | HR 107 | Temp 98.2°F | Resp 16 | Ht 66.0 in | Wt 244.6 lb

## 2023-09-08 DIAGNOSIS — I10 Essential (primary) hypertension: Secondary | ICD-10-CM

## 2023-09-08 DIAGNOSIS — R635 Abnormal weight gain: Secondary | ICD-10-CM

## 2023-09-08 DIAGNOSIS — Z6839 Body mass index (BMI) 39.0-39.9, adult: Secondary | ICD-10-CM

## 2023-09-08 DIAGNOSIS — G5793 Unspecified mononeuropathy of bilateral lower limbs: Secondary | ICD-10-CM

## 2023-09-08 DIAGNOSIS — N950 Postmenopausal bleeding: Secondary | ICD-10-CM | POA: Diagnosis not present

## 2023-09-08 NOTE — Progress Notes (Signed)
[facility] [address] [place], [place] [address]  Internal MEDICINE  Office Visit Note  Patient Name: Yvonne Davis  [id number]  [id number]  Date of Service: 09/08/2023  Chief Complaint  Patient presents with   Hypertension   Follow-up    HPI Yvonne Davis presents for a follow-up visit for hypertension, vaginal spotting BP is improved, no issues since then Vaginal spotting. Last time she was sexually active was 08/26/23. LMP 04/22/2010. Had a partial hysterectomy -- left her ovaries.  Neuropathy -- taking gabapentin which helps.  Weight loss -- zepbound ordered, waiting for PA to be done.   Current Medication: Outpatient Encounter Medications as of 09/08/2023  Medication Sig   cyclobenzaprine (FLEXERIL) 10 MG tablet Take 1 tablet (10 mg total) by mouth at bedtime as needed for muscle spasms.   escitalopram (LEXAPRO) 5 MG tablet Take 1 tablet (5 mg total) by mouth daily.   gabapentin (NEURONTIN) 100 MG capsule Take 1 capsule (100 mg total) by mouth 3 (three) times daily.   pravastatin (PRAVACHOL) 10 MG tablet Take 1 tablet (10 mg total) by mouth daily.   telmisartan-hydrochlorothiazide (MICARDIS HCT) 40-12.5 MG tablet Take 1 tablet by mouth daily.   tirzepatide (ZEPBOUND) 2.5 MG/0.5ML Pen Inject 2.5 mg into the skin once a week.   tirzepatide (ZEPBOUND) 5 MG/0.5ML Pen Inject 5 mg into the skin once a week.   Vitamin D, Ergocalciferol, (DRISDOL) 1.25 MG (50000 UNIT) CAPS capsule Take 1 capsule (50,000 Units total) by mouth every 7 (seven) days.   No facility-administered encounter medications on file as of 09/08/2023.    Surgical History: Past Surgical History:  Procedure Laterality Date   ABDOMINAL HYSTERECTOMY     BREAST BIOPSY Left 03/07/2019   left bc lymp node US bx  LYMPH NODE NEGATIVE FOR MORPHOLOGIC FEATURES OF A NEOPLASTIC PROCESS.   BREAST BIOPSY Left 04/27/2023   Stereo bx, Coil Clip, Path pending   BREAST BIOPSY Left 04/27/2023   MM LT BREAST BX W LOC DEV  1ST LESION IMAGE BX SPEC STEREO GUIDE 04/27/2023 [facility]    Medical History: Past Medical History:  Diagnosis Date   Heart palpitations    Hypertension     Family History: Family History  Problem Relation Age of Onset   Cancer Father    Breast cancer Maternal Uncle 56   Breast cancer Maternal Grandmother 30    Social History   Socioeconomic History   Marital status: Single    Spouse name: Not on file   Number of children: Not on file   Years of education: Not on file   Highest education level: Not on file  Occupational History   Not on file  Tobacco Use   Smoking status: Every Day    Current packs/day: 1.00    Types: Cigarettes   Smokeless tobacco: Never  Substance and Sexual Activity   Alcohol use: No   Drug use: Never   Sexual activity: Yes  Other Topics Concern   Not on file  Social History Narrative   Not on file   Social Determinants of Health   Financial Resource Strain: Not on file  Food Insecurity: Not on file  Transportation Needs: Not on file  Physical Activity: Not on file  Stress: Not on file  Social Connections: Not on file  Intimate Partner Violence: Not on file      Review of Systems  Constitutional:  Positive for fatigue and unexpected weight change. Negative for chills.  HENT:  Negative for congestion,  postnasal drip, rhinorrhea, sneezing and sore throat.   Eyes:  Negative for redness.  Respiratory: Negative.  Negative for cough, chest tightness, shortness of breath and wheezing.   Cardiovascular: Negative.  Negative for chest pain and palpitations.  Gastrointestinal: Negative.  Negative for abdominal pain, constipation, diarrhea, nausea and vomiting.  Endocrine: Negative for polyphagia.  Genitourinary:  Negative for dysuria and frequency.  Musculoskeletal:  Positive for arthralgias (right elbow, right hand). Negative for back pain, joint swelling and neck pain.       Feet hurt  Skin:  Negative for rash.  Neurological:   Positive for dizziness. Negative for tremors and numbness.  Psychiatric/Behavioral:  Negative for behavioral problems (Depression), sleep disturbance and suicidal ideas. The patient is not nervous/anxious.     Vital Signs: BP 120/78   Pulse (!) 107   Temp 98.2 F (36.8 C)   Resp 16   Ht 5\' 6"  (1.676 m)   Wt 244 lb 9.6 oz (110.9 kg)   SpO2 99%   BMI 39.48 kg/m    Physical Exam Vitals reviewed.  Constitutional:      General: She is not in acute distress.    Appearance: Normal appearance. She is obese. She is not ill-appearing.  HENT:     Head: Normocephalic and atraumatic.  Eyes:     Pupils: Pupils are equal, round, and reactive to light.  Cardiovascular:     Rate and Rhythm: Normal rate and regular rhythm.  Pulmonary:     Effort: Pulmonary effort is normal. No respiratory distress.  Neurological:     Mental Status: She is alert and oriented to person, place, and time.  Psychiatric:        Mood and Affect: Mood normal.        Behavior: Behavior normal.        Assessment/Plan: 1. Postmenopausal vaginal bleeding Labs and ultrasound ordered, will discuss results at follow up  - Estradiol - FSH/LH - CBC with Differential/Platelet - US Pelvic Complete With Transvaginal; Future  2. Primary hypertension Continue medication as prescribed.   3. Abnormal weight gain Waiting for pa for zepbound   4. Class 2 severe obesity due to excess calories with serious comorbidity and body mass index (BMI) of 39.0 to 39.9 in adult Yvonne Davis) Waiting for PA for zepbound   5. Neuropathy of both feet Continue gabapentin as prescribed    General Counseling: [name] verbalizes understanding of the findings of todays visit and agrees with plan of treatment. I have discussed any further diagnostic evaluation that may be needed or ordered today. We also reviewed her medications today. she has been encouraged to call the office with any questions or concerns that should arise related to todays  visit.    Orders Placed This Encounter  Procedures   US Pelvic Complete With Transvaginal   Estradiol   FSH/LH   CBC with Differential/Platelet    No orders of the defined types were placed in this encounter.   Return for F/U labs and pelvix ultrasound , Yandriel Boening PCP.   Total time spent:30 Minutes Time spent includes review of chart, medications, test results, and follow up plan with the patient.   [place] Controlled Substance Database was reviewed by me.  This patient was seen by Sallyanne Kuster, FNP-C in collaboration with Dr. Beverely Risen as a part of collaborative care agreement.   Moyses Pavey R. Tedd Sias, MSN, FNP-C Internal medicine

## 2023-09-09 ENCOUNTER — Telehealth: Payer: Self-pay | Admitting: Nurse Practitioner

## 2023-09-09 NOTE — Telephone Encounter (Signed)
Notified patient of U/S appointment date, arrival time, location and to arrive with full bladder-Yvonne Davis

## 2023-09-10 ENCOUNTER — Ambulatory Visit (INDEPENDENT_AMBULATORY_CARE_PROVIDER_SITE_OTHER): Payer: BC Managed Care – PPO

## 2023-09-10 DIAGNOSIS — N950 Postmenopausal bleeding: Secondary | ICD-10-CM | POA: Diagnosis not present

## 2023-09-10 NOTE — Progress Notes (Signed)
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   Orthotics were dispensed and fit was satisfactory. Reviewed instructions for break-in and wear. Written instructions given to patient.  Patient will follow up as needed.   Addison Bailey Cped, CFo, CFm

## 2023-09-11 LAB — CBC WITH DIFFERENTIAL/PLATELET
Basophils Absolute: 0.1 10*3/uL (ref 0.0–0.2)
Basos: 1 %
EOS (ABSOLUTE): 0.2 10*3/uL (ref 0.0–0.4)
Eos: 2 %
Hematocrit: 42.4 % (ref 34.0–46.6)
Hemoglobin: 13.6 g/dL (ref 11.1–15.9)
Immature Grans (Abs): 0.1 10*3/uL (ref 0.0–0.1)
Immature Granulocytes: 1 %
Lymphocytes Absolute: 3 10*3/uL (ref 0.7–3.1)
Lymphs: 27 %
MCH: 29.8 pg (ref 26.6–33.0)
MCHC: 32.1 g/dL (ref 31.5–35.7)
MCV: 93 fL (ref 79–97)
Monocytes Absolute: 0.7 10*3/uL (ref 0.1–0.9)
Monocytes: 6 %
Neutrophils Absolute: 7.1 10*3/uL — ABNORMAL HIGH (ref 1.4–7.0)
Neutrophils: 63 %
Platelets: 372 10*3/uL (ref 150–450)
RBC: 4.56 x10E6/uL (ref 3.77–5.28)
RDW: 13.1 % (ref 11.7–15.4)
WBC: 11.2 10*3/uL — ABNORMAL HIGH (ref 3.4–10.8)

## 2023-09-11 LAB — FSH/LH
FSH: 4 m[IU]/mL
LH: 3.8 m[IU]/mL

## 2023-09-11 LAB — ESTRADIOL: Estradiol: 30.1 pg/mL

## 2023-09-16 ENCOUNTER — Ambulatory Visit
Admission: RE | Admit: 2023-09-16 | Discharge: 2023-09-16 | Disposition: A | Discharge status: Home / Self Care | Payer: BC Managed Care – PPO | Source: Ambulatory Visit | Attending: Nurse Practitioner | Admitting: Nurse Practitioner

## 2023-09-16 DIAGNOSIS — Z90710 Acquired absence of both cervix and uterus: Secondary | ICD-10-CM | POA: Diagnosis not present

## 2023-09-16 DIAGNOSIS — N950 Postmenopausal bleeding: Secondary | ICD-10-CM | POA: Insufficient documentation

## 2023-09-27 ENCOUNTER — Ambulatory Visit: Payer: BC Managed Care – PPO | Admitting: Nurse Practitioner

## 2023-09-29 ENCOUNTER — Telehealth: Payer: Self-pay

## 2023-10-04 ENCOUNTER — Telehealth: Payer: Self-pay | Admitting: Nurse Practitioner

## 2023-10-06 ENCOUNTER — Ambulatory Visit: Payer: BC Managed Care – PPO | Admitting: Nurse Practitioner

## 2023-10-06 MED ORDER — PHENTERMINE HCL 37.5 MG PO TABS: 37.5000 mg | ORAL_TABLET | Freq: Every day | ORAL | 1 refills | Status: DC

## 2023-10-06 NOTE — Progress Notes (Signed)
Lab levels are showing that patient is not postmenopausal yet so the bleeding may have been due to irregular periods. She is most likely approaching meopause but at her current estrogen level and FSH and LH levels, she can still have periods.

## 2023-10-06 NOTE — Telephone Encounter (Signed)
Error

## 2023-10-06 NOTE — Progress Notes (Signed)
Pelvic ultrasound was normal. No specific cause of postmenopausal bleeding found. If still having the issue, referral to OBGYN would be next step

## 2023-10-06 NOTE — Telephone Encounter (Signed)
Done by Vanice Sarah

## 2023-10-18 ENCOUNTER — Ambulatory Visit (INDEPENDENT_AMBULATORY_CARE_PROVIDER_SITE_OTHER): Payer: BC Managed Care – PPO | Admitting: Nurse Practitioner

## 2023-10-18 ENCOUNTER — Encounter: Payer: Self-pay | Admitting: Nurse Practitioner

## 2023-10-18 VITALS — BP 120/82 | HR 80 | Temp 96.6°F | Resp 16 | Ht 66.0 in | Wt 246.6 lb

## 2023-10-18 DIAGNOSIS — E782 Mixed hyperlipidemia: Secondary | ICD-10-CM

## 2023-10-18 DIAGNOSIS — Z9189 Other specified personal risk factors, not elsewhere classified: Secondary | ICD-10-CM

## 2023-10-18 DIAGNOSIS — I10 Essential (primary) hypertension: Secondary | ICD-10-CM | POA: Diagnosis not present

## 2023-10-18 MED ORDER — TIRZEPATIDE-WEIGHT MANAGEMENT 5 MG/0.5ML ~~LOC~~ SOAJ
5.0000 mg | SUBCUTANEOUS | 5 refills | Status: DC
Start: 2023-10-18 — End: 2024-01-12

## 2023-10-18 NOTE — Progress Notes (Signed)
[facility] [address] [place], [place] [address]  Internal MEDICINE  Office Visit Note  Patient Name: Yvonne Davis  [id number]  [id number]  Date of Service: 10/18/2023  Chief Complaint  Patient presents with   Hypertension   Follow-up    Review U/S    HPI [name] presents for a follow-up visit for hypertension, hyperlipidemia, postmenopausal bleeding, and weight loss.  Weight loss --was not able to get zepbound, will try reordering. BMI 39.80.  Postmenopausal bleeding -- stopped for now, pelvic ultrasound normal.  Hypertension -- controlled with current medications Hyperlipidemia -- takes pravastatin    Current Medication: Outpatient Encounter Medications as of 10/18/2023  Medication Sig   cyclobenzaprine (FLEXERIL) 10 MG tablet Take 1 tablet (10 mg total) by mouth at bedtime as needed for muscle spasms.   escitalopram (LEXAPRO) 5 MG tablet Take 1 tablet (5 mg total) by mouth daily.   gabapentin (NEURONTIN) 100 MG capsule Take 1 capsule (100 mg total) by mouth 3 (three) times daily.   phentermine (ADIPEX-P) 37.5 MG tablet Take 1 tablet (37.5 mg total) by mouth daily before breakfast.   pravastatin (PRAVACHOL) 10 MG tablet Take 1 tablet (10 mg total) by mouth daily.   telmisartan-hydrochlorothiazide (MICARDIS HCT) 40-12.5 MG tablet Take 1 tablet by mouth daily.   tirzepatide (ZEPBOUND) 5 MG/0.5ML Pen Inject 5 mg into the skin once a week.   Vitamin D, Ergocalciferol, (DRISDOL) 1.25 MG (50000 UNIT) CAPS capsule Take 1 capsule (50,000 Units total) by mouth every 7 (seven) days.   No facility-administered encounter medications on file as of 10/18/2023.    Surgical History: Past Surgical History:  Procedure Laterality Date   ABDOMINAL HYSTERECTOMY     BREAST BIOPSY Left 03/07/2019   left bc lymp node US bx  LYMPH NODE NEGATIVE FOR MORPHOLOGIC FEATURES OF A NEOPLASTIC PROCESS.   BREAST BIOPSY Left 04/27/2023   Stereo bx, Coil Clip, Path pending   BREAST  BIOPSY Left 04/27/2023   MM LT BREAST BX W LOC DEV 1ST LESION IMAGE BX SPEC STEREO GUIDE 04/27/2023 [facility]    Medical History: Past Medical History:  Diagnosis Date   Heart palpitations    Hypertension     Family History: Family History  Problem Relation Age of Onset   Cancer Father    Breast cancer Maternal Uncle 44   Breast cancer Maternal Grandmother 73    Social History   Socioeconomic History   Marital status: Single    Spouse name: Not on file   Number of children: Not on file   Years of education: Not on file   Highest education level: Not on file  Occupational History   Not on file  Tobacco Use   Smoking status: Every Day    Current packs/day: 1.00    Types: Cigarettes   Smokeless tobacco: Never  Substance and Sexual Activity   Alcohol use: No   Drug use: Never   Sexual activity: Yes  Other Topics Concern   Not on file  Social History Narrative   Not on file   Social Determinants of Health   Financial Resource Strain: Not on file  Food Insecurity: Not on file  Transportation Needs: Not on file  Physical Activity: Not on file  Stress: Not on file  Social Connections: Not on file  Intimate Partner Violence: Not on file      Review of Systems  Constitutional:  Positive for fatigue and unexpected weight change. Negative for chills.  HENT:  Negative for  congestion, postnasal drip, rhinorrhea, sneezing and sore throat.   Eyes:  Negative for redness.  Respiratory: Negative.  Negative for cough, chest tightness, shortness of breath and wheezing.   Cardiovascular: Negative.  Negative for chest pain and palpitations.  Gastrointestinal: Negative.  Negative for abdominal pain, constipation, diarrhea, nausea and vomiting.  Endocrine: Negative for polyphagia.  Genitourinary:  Negative for dysuria and frequency.  Musculoskeletal:  Positive for arthralgias (right elbow, right hand). Negative for back pain, joint swelling and neck pain.       Feet hurt   Skin:  Negative for rash.  Neurological:  Positive for dizziness. Negative for tremors and numbness.  Psychiatric/Behavioral:  Negative for behavioral problems (Depression), sleep disturbance and suicidal ideas. The patient is not nervous/anxious.     Vital Signs: BP 120/82   Pulse 80   Temp (!) 96.6 F (35.9 C)   Resp 16   Ht 5\' 6"  (1.676 m)   Wt 246 lb 9.6 oz (111.9 kg)   SpO2 99%   BMI 39.80 kg/m    Physical Exam Vitals reviewed.  Constitutional:      General: She is not in acute distress.    Appearance: Normal appearance. She is obese. She is not ill-appearing.  HENT:     Head: Normocephalic and atraumatic.  Eyes:     Pupils: Pupils are equal, round, and reactive to light.  Cardiovascular:     Rate and Rhythm: Normal rate and regular rhythm.  Pulmonary:     Effort: Pulmonary effort is normal. No respiratory distress.  Neurological:     Mental Status: She is alert and oriented to person, place, and time.  Psychiatric:        Mood and Affect: Mood normal.        Behavior: Behavior normal.        Assessment/Plan: 1. Primary hypertension (Primary) Continue telmisartan-hydrochlorothiazide as prescribed.   2. Mixed hyperlipidemia Continue pravastatin as prescribed.   3. At increased risk for cardiovascular disease Continue pravastatin and telmisartan-hydrochlorothiazide as prescribed. Zepbound may be cardioprotective since it is a GLP-1 receptor antagonist.   4. Morbid obesity (HCC) Zepbound resent, will try to get prior auth approved  - tirzepatide (ZEPBOUND) 5 MG/0.5ML Pen; Inject 5 mg into the skin once a week.  Dispense: 2 mL; Refill: 5   General Counseling: [name] verbalizes understanding of the findings of todays visit and agrees with plan of treatment. I have discussed any further diagnostic evaluation that may be needed or ordered today. We also reviewed her medications today. she has been encouraged to call the office with any questions or concerns  that should arise related to todays visit.    No orders of the defined types were placed in this encounter.   Meds ordered this encounter  Medications   tirzepatide (ZEPBOUND) 5 MG/0.5ML Pen    Sig: Inject 5 mg into the skin once a week.    Dispense:  2 mL    Refill:  5    Tried phentermine with no success, dx code E66.01    Return in about 1 month (around 11/18/2023).   Total time spent:30 Minutes Time spent includes review of chart, medications, test results, and follow up plan with the patient.   [place] Controlled Substance Database was reviewed by me.  This patient was seen by Sallyanne Kuster, FNP-C in collaboration with Dr. Beverely Risen as a part of collaborative care agreement.   Mikle Sternberg R. Tedd Sias, MSN, FNP-C Internal medicine

## 2023-10-19 ENCOUNTER — Telehealth: Payer: Self-pay

## 2023-10-19 NOTE — Telephone Encounter (Signed)
Patient was seen yesterday.

## 2023-10-29 ENCOUNTER — Telehealth: Payer: Self-pay

## 2023-10-29 NOTE — Telephone Encounter (Signed)
Completed PA for Zepbound

## 2023-11-02 ENCOUNTER — Encounter: Payer: Self-pay | Admitting: Nurse Practitioner

## 2023-11-12 ENCOUNTER — Other Ambulatory Visit: Payer: Self-pay | Admitting: Nurse Practitioner

## 2023-11-12 DIAGNOSIS — R635 Abnormal weight gain: Secondary | ICD-10-CM

## 2023-11-12 DIAGNOSIS — E782 Mixed hyperlipidemia: Secondary | ICD-10-CM

## 2023-11-16 ENCOUNTER — Ambulatory Visit (INDEPENDENT_AMBULATORY_CARE_PROVIDER_SITE_OTHER): Payer: BC Managed Care – PPO | Admitting: Nurse Practitioner

## 2023-11-16 ENCOUNTER — Encounter: Payer: Self-pay | Admitting: Nurse Practitioner

## 2023-11-16 VITALS — BP 116/72 | HR 93 | Temp 98.2°F | Resp 16 | Ht 66.0 in | Wt 242.0 lb

## 2023-11-16 DIAGNOSIS — R200 Anesthesia of skin: Secondary | ICD-10-CM | POA: Diagnosis not present

## 2023-11-16 DIAGNOSIS — Z9189 Other specified personal risk factors, not elsewhere classified: Secondary | ICD-10-CM

## 2023-11-16 DIAGNOSIS — G5793 Unspecified mononeuropathy of bilateral lower limbs: Secondary | ICD-10-CM

## 2023-11-16 MED ORDER — PHENTERMINE HCL 37.5 MG PO TABS
37.5000 mg | ORAL_TABLET | Freq: Every day | ORAL | 1 refills | Status: DC
Start: 2023-11-16 — End: 2024-01-12

## 2023-11-16 NOTE — Progress Notes (Signed)
[facility] [address] [place], [place] [address]  Internal MEDICINE  Office Visit Note  Patient Name: Yvonne Davis  [id number]  [id number]  Date of Service: 11/16/2023  Chief Complaint  Patient presents with   Hypertension   Follow-up    HPI Shequilla presents for a follow-up visit for weight loss, neuropathy, numbness of legs and low back pain.  Weight loss -- lost 4 lbs with phentermine, zepbound did not get approved, will try again in January with new insurance, states they will cover weight loss medications better.  Neuropathy in feet -- burning and tingling in feet esp toes and arch bilaterally. Seen by podiatry, was told her feet are ok and that her pain is coming from her back. Has custom arch supports that make her feet her more than they did before. Was put on gabapentin which does not help her pain at all.  Numbness of lateral aspect of lower legs bilaterally  History of herniated disc in lumbar spine, was sent to physical therapy and has not had any more low back pain since then.     Current Medication: Outpatient Encounter Medications as of 11/16/2023  Medication Sig   cyclobenzaprine (FLEXERIL) 10 MG tablet Take 1 tablet (10 mg total) by mouth at bedtime as needed for muscle spasms.   escitalopram (LEXAPRO) 5 MG tablet Take 1 tablet (5 mg total) by mouth daily.   gabapentin (NEURONTIN) 100 MG capsule Take 1 capsule (100 mg total) by mouth 3 (three) times daily.   pravastatin (PRAVACHOL) 10 MG tablet TAKE 1 TABLET(10 MG) BY MOUTH DAILY   telmisartan-hydrochlorothiazide (MICARDIS HCT) 40-12.5 MG tablet Take 1 tablet by mouth daily.   tirzepatide (ZEPBOUND) 5 MG/0.5ML Pen Inject 5 mg into the skin once a week.   Vitamin D, Ergocalciferol, (DRISDOL) 1.25 MG (50000 UNIT) CAPS capsule Take 1 capsule (50,000 Units total) by mouth every 7 (seven) days.   [DISCONTINUED] phentermine (ADIPEX-P) 37.5 MG tablet Take 1 tablet (37.5 mg total) by mouth daily before  breakfast.   phentermine (ADIPEX-P) 37.5 MG tablet Take 1 tablet (37.5 mg total) by mouth daily before breakfast.   No facility-administered encounter medications on file as of 11/16/2023.    Surgical History: Past Surgical History:  Procedure Laterality Date   ABDOMINAL HYSTERECTOMY     BREAST BIOPSY Left 03/07/2019   left bc lymp node US bx  LYMPH NODE NEGATIVE FOR MORPHOLOGIC FEATURES OF A NEOPLASTIC PROCESS.   BREAST BIOPSY Left 04/27/2023   Stereo bx, Coil Clip, Path pending   BREAST BIOPSY Left 04/27/2023   MM LT BREAST BX W LOC DEV 1ST LESION IMAGE BX SPEC STEREO GUIDE 04/27/2023 [facility]    Medical History: Past Medical History:  Diagnosis Date   Heart palpitations    Hypertension     Family History: Family History  Problem Relation Age of Onset   Cancer Father    Breast cancer Maternal Uncle 84   Breast cancer Maternal Grandmother 32    Social History   Socioeconomic History   Marital status: Single    Spouse name: Not on file   Number of children: Not on file   Years of education: Not on file   Highest education level: Not on file  Occupational History   Not on file  Tobacco Use   Smoking status: Every Day    Current packs/day: 1.00    Types: Cigarettes   Smokeless tobacco: Never  Substance and Sexual Activity   Alcohol use: No  Drug use: Never   Sexual activity: Yes  Other Topics Concern   Not on file  Social History Narrative   Not on file   Social Determinants of Health   Financial Resource Strain: Not on file  Food Insecurity: Not on file  Transportation Needs: Not on file  Physical Activity: Not on file  Stress: Not on file  Social Connections: Not on file  Intimate Partner Violence: Not on file      Review of Systems  Constitutional:  Positive for fatigue and unexpected weight change. Negative for chills.  HENT:  Negative for congestion, postnasal drip, rhinorrhea, sneezing and sore throat.   Eyes:  Negative for redness.   Respiratory: Negative.  Negative for cough, chest tightness, shortness of breath and wheezing.   Cardiovascular: Negative.  Negative for chest pain and palpitations.  Gastrointestinal: Negative.  Negative for abdominal pain, constipation, diarrhea, nausea and vomiting.  Endocrine: Negative for polyphagia.  Genitourinary:  Negative for dysuria and frequency.  Musculoskeletal:  Positive for arthralgias (right elbow, right hand), back pain and gait problem. Negative for joint swelling and neck pain.       Feet hurt  Skin:  Negative for rash.  Neurological:  Positive for dizziness. Negative for tremors and numbness.  Psychiatric/Behavioral:  Negative for behavioral problems (Depression), sleep disturbance and suicidal ideas. The patient is not nervous/anxious.     Vital Signs: BP 116/72   Pulse 93   Temp 98.2 F (36.8 C)   Resp 16   Ht 5\' 6"  (1.676 m)   Wt 242 lb (109.8 kg)   SpO2 97%   BMI 39.06 kg/m    Physical Exam Vitals reviewed.  Constitutional:      General: She is not in acute distress.    Appearance: Normal appearance. She is obese. She is not ill-appearing.  HENT:     Head: Normocephalic and atraumatic.  Eyes:     Pupils: Pupils are equal, round, and reactive to light.  Cardiovascular:     Rate and Rhythm: Normal rate and regular rhythm.  Pulmonary:     Effort: Pulmonary effort is normal. No respiratory distress.  Neurological:     Mental Status: She is alert and oriented to person, place, and time.  Psychiatric:        Mood and Affect: Mood normal.        Behavior: Behavior normal.        Assessment/Plan: 1. Neuropathy of both feet Referred to neurosurgery - Ambulatory referral to Neurosurgery  2. Bilateral leg numbness Referred to neurosurgery  - Ambulatory referral to Neurosurgery  3. Morbid obesity (HCC) associated with high cholesterol, hypertension and neuropathy related to low back pain.  Continue phentermine as prescribed. Follow up in 8  weeks in January to discuss other options and attempt prescribing zepbound under her new insurance.  - phentermine (ADIPEX-P) 37.5 MG tablet; Take 1 tablet (37.5 mg total) by mouth daily before breakfast.  Dispense: 30 tablet; Refill: 1     General Counseling: Ineta verbalizes understanding of the findings of todays visit and agrees with plan of treatment. I have discussed any further diagnostic evaluation that may be needed or ordered today. We also reviewed her medications today. she has been encouraged to call the office with any questions or concerns that should arise related to todays visit.    Orders Placed This Encounter  Procedures   Ambulatory referral to Neurosurgery    Meds ordered this encounter  Medications   phentermine (ADIPEX-P) 37.5  MG tablet    Sig: Take 1 tablet (37.5 mg total) by mouth daily before breakfast.    Dispense:  30 tablet    Refill:  1    Return in about 8 weeks (around 01/11/2024) for F/U, Weight loss, Melenie Minniear PCP.   Total time spent:30 Minutes Time spent includes review of chart, medications, test results, and follow up plan with the patient.   [place] Controlled Substance Database was reviewed by me.  This patient was seen by Sallyanne Kuster, FNP-C in collaboration with Dr. Beverely Risen as a part of collaborative care agreement.   Ahava Kissoon R. Tedd Sias, MSN, FNP-C Internal medicine

## 2023-11-16 NOTE — Telephone Encounter (Signed)
done

## 2023-11-17 ENCOUNTER — Telehealth: Payer: Self-pay | Admitting: Nurse Practitioner

## 2023-11-17 ENCOUNTER — Encounter: Payer: Self-pay | Admitting: Nurse Practitioner

## 2023-11-17 DIAGNOSIS — R200 Anesthesia of skin: Secondary | ICD-10-CM | POA: Insufficient documentation

## 2023-11-17 DIAGNOSIS — G5793 Unspecified mononeuropathy of bilateral lower limbs: Secondary | ICD-10-CM | POA: Insufficient documentation

## 2023-11-17 NOTE — Telephone Encounter (Signed)
Urgent Neurosurgery referral sent via [facility] to [facility]. Notified patient. Gave telephone # [phone]

## 2023-11-22 ENCOUNTER — Ambulatory Visit (INDEPENDENT_AMBULATORY_CARE_PROVIDER_SITE_OTHER): Payer: BC Managed Care – PPO | Admitting: Physician Assistant

## 2023-11-22 ENCOUNTER — Encounter: Payer: Self-pay | Admitting: Physician Assistant

## 2023-11-22 VITALS — BP 138/100 | HR 114 | Temp 98.4°F | Resp 16 | Ht 66.0 in | Wt 247.0 lb

## 2023-11-22 DIAGNOSIS — J0100 Acute maxillary sinusitis, unspecified: Secondary | ICD-10-CM

## 2023-11-22 MED ORDER — AZITHROMYCIN 250 MG PO TABS
ORAL_TABLET | ORAL | 0 refills | Status: AC
Start: 2023-11-22 — End: 2023-11-27

## 2023-11-22 NOTE — Progress Notes (Signed)
[facility] [address] [place], [place] [address]  Internal MEDICINE  Office Visit Note  Patient Name: Yvonne Davis  [id number]  [id number]  Date of Service: 11/26/2023  Chief Complaint  Patient presents with   Acute Visit   Sinusitis   Wheezing   Shortness of Breath     HPI Pt is here for a sick visit. -Started with symptoms on Wednesday and have progressed -Sinus congestion, sinus pressure, coughing, wheezing at night, some ear pressure. No fever or chills. NO N/V/D, no body aches -negative for covid -Non productive cough -using nettipot, advil sinus, sudafed--advised to not combine with phentermine and will hold on this while sick -Something has been going around work, but unsure what -does smoke, no underlying asthma -140/98 on recheck, likely due to medications  Current Medication:  Outpatient Encounter Medications as of 11/22/2023  Medication Sig   azithromycin (ZITHROMAX) 250 MG tablet Take 2 tablets on day 1, then 1 tablet daily on days 2 through 5   cyclobenzaprine (FLEXERIL) 10 MG tablet Take 1 tablet (10 mg total) by mouth at bedtime as needed for muscle spasms.   escitalopram (LEXAPRO) 5 MG tablet Take 1 tablet (5 mg total) by mouth daily.   gabapentin (NEURONTIN) 100 MG capsule Take 1 capsule (100 mg total) by mouth 3 (three) times daily.   phentermine (ADIPEX-P) 37.5 MG tablet Take 1 tablet (37.5 mg total) by mouth daily before breakfast.   pravastatin (PRAVACHOL) 10 MG tablet TAKE 1 TABLET(10 MG) BY MOUTH DAILY   telmisartan-hydrochlorothiazide (MICARDIS HCT) 40-12.5 MG tablet Take 1 tablet by mouth daily.   tirzepatide (ZEPBOUND) 5 MG/0.5ML Pen Inject 5 mg into the skin once a week.   Vitamin D, Ergocalciferol, (DRISDOL) 1.25 MG (50000 UNIT) CAPS capsule Take 1 capsule (50,000 Units total) by mouth every 7 (seven) days.   No facility-administered encounter medications on file as of 11/22/2023.      Medical History: Past Medical  History:  Diagnosis Date   Heart palpitations    Hypertension      Vital Signs: BP (!) 138/100   Pulse (!) 114   Temp 98.4 F (36.9 C)   Resp 16   Ht 5\' 6"  (1.676 m)   Wt 247 lb (112 kg)   SpO2 98%   BMI 39.87 kg/m    Review of Systems  Constitutional:  Negative for fatigue and fever.  HENT:  Positive for congestion, ear pain, postnasal drip and sinus pressure. Negative for mouth sores.   Respiratory:  Positive for cough and wheezing. Negative for shortness of breath.   Cardiovascular:  Negative for chest pain.  Gastrointestinal:  Negative for diarrhea, nausea and vomiting.  Genitourinary:  Negative for flank pain.  Psychiatric/Behavioral: Negative.      Physical Exam Vitals reviewed.  Constitutional:      General: She is not in acute distress.    Appearance: Normal appearance. She is obese. She is not ill-appearing.  HENT:     Head: Normocephalic and atraumatic.  Eyes:     Pupils: Pupils are equal, round, and reactive to light.  Cardiovascular:     Rate and Rhythm: Normal rate and regular rhythm.  Pulmonary:     Effort: Pulmonary effort is normal. No respiratory distress.  Neurological:     Mental Status: She is alert and oriented to person, place, and time.  Psychiatric:        Mood and Affect: Mood normal.        Behavior: Behavior  normal.       Assessment/Plan: 1. Acute non-recurrent maxillary sinusitis Will send zpak and may use mucinex and flonase for congestion - azithromycin (ZITHROMAX) 250 MG tablet; Take 2 tablets on day 1, then 1 tablet daily on days 2 through 5  Dispense: 6 tablet; Refill: 0   General Counseling: Kiyanna verbalizes understanding of the findings of todays visit and agrees with plan of treatment. I have discussed any further diagnostic evaluation that may be needed or ordered today. We also reviewed her medications today. she has been encouraged to call the office with any questions or concerns that should arise related to todays  visit.    Counseling:    No orders of the defined types were placed in this encounter.   Meds ordered this encounter  Medications   azithromycin (ZITHROMAX) 250 MG tablet    Sig: Take 2 tablets on day 1, then 1 tablet daily on days 2 through 5    Dispense:  6 tablet    Refill:  0    Time spent:25 Minutes

## 2023-11-24 ENCOUNTER — Telehealth: Payer: Self-pay | Admitting: Nurse Practitioner

## 2023-11-24 NOTE — Telephone Encounter (Signed)
Emailed extended work note to [employer]

## 2023-12-07 ENCOUNTER — Encounter: Payer: Self-pay | Admitting: Nurse Practitioner

## 2023-12-12 ENCOUNTER — Other Ambulatory Visit: Payer: Self-pay | Admitting: Podiatry

## 2023-12-12 ENCOUNTER — Other Ambulatory Visit: Payer: Self-pay | Admitting: Nurse Practitioner

## 2023-12-12 DIAGNOSIS — I10 Essential (primary) hypertension: Secondary | ICD-10-CM

## 2023-12-12 DIAGNOSIS — M7701 Medial epicondylitis, right elbow: Secondary | ICD-10-CM

## 2023-12-13 ENCOUNTER — Telehealth: Payer: Self-pay | Admitting: Nurse Practitioner

## 2023-12-13 NOTE — Telephone Encounter (Signed)
Patient called regarding neurosurgery referral. I explained to her I sent via [employer] 11/17/23 and that I gave her their telephone #. Gave her # again-Toni

## 2024-01-12 ENCOUNTER — Ambulatory Visit (INDEPENDENT_AMBULATORY_CARE_PROVIDER_SITE_OTHER): Payer: BC Managed Care – PPO | Admitting: Nurse Practitioner

## 2024-01-12 ENCOUNTER — Encounter: Payer: Self-pay | Admitting: Nurse Practitioner

## 2024-01-12 VITALS — BP 138/80 | HR 86 | Temp 97.2°F | Resp 16 | Ht 66.0 in | Wt 247.0 lb

## 2024-01-12 DIAGNOSIS — G5793 Unspecified mononeuropathy of bilateral lower limbs: Secondary | ICD-10-CM | POA: Diagnosis not present

## 2024-01-12 DIAGNOSIS — I10 Essential (primary) hypertension: Secondary | ICD-10-CM

## 2024-01-12 DIAGNOSIS — E782 Mixed hyperlipidemia: Secondary | ICD-10-CM | POA: Diagnosis not present

## 2024-01-12 MED ORDER — TIRZEPATIDE-WEIGHT MANAGEMENT 5 MG/0.5ML ~~LOC~~ SOAJ: 5.0000 mg | SUBCUTANEOUS | 5 refills | Status: DC

## 2024-01-12 MED ORDER — TIRZEPATIDE-WEIGHT MANAGEMENT 2.5 MG/0.5ML ~~LOC~~ SOAJ: 2.5000 mg | SUBCUTANEOUS | 0 refills | Status: DC

## 2024-01-12 MED ORDER — PRAVASTATIN SODIUM 10 MG PO TABS: 10.0000 mg | ORAL_TABLET | Freq: Every day | ORAL | 3 refills | Status: DC

## 2024-01-12 MED ORDER — GABAPENTIN 100 MG PO CAPS: 100.0000 mg | ORAL_CAPSULE | Freq: Every day | ORAL | 5 refills | Status: DC

## 2024-01-12 MED ORDER — GABAPENTIN 300 MG PO CAPS: 300.0000 mg | ORAL_CAPSULE | Freq: Every day | ORAL | 5 refills | Status: DC

## 2024-01-12 NOTE — Progress Notes (Signed)
[facility] [address] [place], [place] [address]  Internal MEDICINE  Office Visit Note  Patient Name: Yvonne Davis  [id number]  [id number]  Date of Service: 01/12/2024  Chief Complaint  Patient presents with   Hypertension   Follow-up    Weight loss     HPI Yvonne Davis presents for a follow-up visit for weight loss, neuropathy and high cholesterol.  Weight loss -- has not gained any weight since December. Wants to try getting zepbound approved, her insurance has changed and they are covering weight loss medications better now. She has tried phentermine in the past and topiramate for weight loss. She did not lose any significant amount at the time. Her current weight is 247 lbs and her BMI is 39.87. She has comorbid conditions including hypertension and hyperlipidemia.  Neuropathy -- has an early march appt with neurosurgery but current dose of gabapentin is not effective in controlling nerve pain.  High cholesterol -- takes pravastatin   Current Medication: Outpatient Encounter Medications as of 01/12/2024  Medication Sig   cyclobenzaprine (FLEXERIL) 10 MG tablet TAKE 1 TABLET(10 MG) BY MOUTH AT BEDTIME AS NEEDED FOR MUSCLE SPASMS   escitalopram (LEXAPRO) 5 MG tablet Take 1 tablet (5 mg total) by mouth daily.   gabapentin (NEURONTIN) 300 MG capsule Take 1 capsule (300 mg total) by mouth at bedtime.   telmisartan-hydrochlorothiazide (MICARDIS HCT) 40-12.5 MG tablet TAKE 1 TABLET BY MOUTH DAILY   tirzepatide (ZEPBOUND) 2.5 MG/0.5ML Pen Inject 2.5 mg into the skin once a week. For 4 doses, then increase to 5 mg weekly   [START ON 02/02/2024] tirzepatide (ZEPBOUND) 5 MG/0.5ML Pen Inject 5 mg into the skin once a week.   Vitamin D, Ergocalciferol, (DRISDOL) 1.25 MG (50000 UNIT) CAPS capsule Take 1 capsule (50,000 Units total) by mouth every 7 (seven) days.   [DISCONTINUED] gabapentin (NEURONTIN) 100 MG capsule TAKE 1 CAPSULE(100 MG) BY MOUTH THREE TIMES DAILY    [DISCONTINUED] phentermine (ADIPEX-P) 37.5 MG tablet Take 1 tablet (37.5 mg total) by mouth daily before breakfast.   [DISCONTINUED] pravastatin (PRAVACHOL) 10 MG tablet TAKE 1 TABLET(10 MG) BY MOUTH DAILY   [DISCONTINUED] tirzepatide (ZEPBOUND) 5 MG/0.5ML Pen Inject 5 mg into the skin once a week.   gabapentin (NEURONTIN) 100 MG capsule Take 1 capsule (100 mg total) by mouth daily with breakfast.   pravastatin (PRAVACHOL) 10 MG tablet Take 1 tablet (10 mg total) by mouth daily.   No facility-administered encounter medications on file as of 01/12/2024.    Surgical History: Past Surgical History:  Procedure Laterality Date   ABDOMINAL HYSTERECTOMY     BREAST BIOPSY Left 03/07/2019   left bc lymp node US bx  LYMPH NODE NEGATIVE FOR MORPHOLOGIC FEATURES OF A NEOPLASTIC PROCESS.   BREAST BIOPSY Left 04/27/2023   Stereo bx, Coil Clip, Path pending   BREAST BIOPSY Left 04/27/2023   MM LT BREAST BX W LOC DEV 1ST LESION IMAGE BX SPEC STEREO GUIDE 04/27/2023 [facility]    Medical History: Past Medical History:  Diagnosis Date   Heart palpitations    Hypertension     Family History: Family History  Problem Relation Age of Onset   Cancer Father    Breast cancer Maternal Uncle 49   Breast cancer Maternal Grandmother 52    Social History   Socioeconomic History   Marital status: Single    Spouse name: Not on file   Number of children: Not on file   Years of education: Not on file  Highest education level: Not on file  Occupational History   Not on file  Tobacco Use   Smoking status: Every Day    Current packs/day: 1.00    Types: Cigarettes   Smokeless tobacco: Never  Substance and Sexual Activity   Alcohol use: No   Drug use: Never   Sexual activity: Yes  Other Topics Concern   Not on file  Social History Narrative   Not on file   Social Drivers of Health   Financial Resource Strain: Not on file  Food Insecurity: Not on file  Transportation Needs: Not on file   Physical Activity: Not on file  Stress: Not on file  Social Connections: Not on file  Intimate Partner Violence: Not on file      Review of Systems  Constitutional:  Positive for fatigue and unexpected weight change. Negative for chills.  HENT:  Negative for congestion, postnasal drip, rhinorrhea, sneezing and sore throat.   Eyes:  Negative for redness.  Respiratory: Negative.  Negative for cough, chest tightness, shortness of breath and wheezing.   Cardiovascular: Negative.  Negative for chest pain and palpitations.  Gastrointestinal: Negative.  Negative for abdominal pain, constipation, diarrhea, nausea and vomiting.  Endocrine: Negative for polyphagia.  Genitourinary:  Negative for dysuria and frequency.  Musculoskeletal:  Positive for arthralgias (right elbow, right hand), back pain and gait problem. Negative for joint swelling and neck pain.       Feet hurt  Skin:  Negative for rash.  Neurological:  Positive for dizziness. Negative for tremors and numbness.  Psychiatric/Behavioral:  Negative for behavioral problems (Depression), sleep disturbance and suicidal ideas. The patient is not nervous/anxious.     Vital Signs: BP (!) 140/80   Pulse 86   Temp (!) 97.2 F (36.2 C)   Resp 16   Ht 5\' 6"  (1.676 m)   Wt 247 lb (112 kg)   SpO2 98%   BMI 39.87 kg/m    Physical Exam Vitals reviewed.  Constitutional:      General: She is not in acute distress.    Appearance: Normal appearance. She is obese. She is not ill-appearing.  HENT:     Head: Normocephalic and atraumatic.  Eyes:     Pupils: Pupils are equal, round, and reactive to light.  Cardiovascular:     Rate and Rhythm: Normal rate and regular rhythm.  Pulmonary:     Effort: Pulmonary effort is normal. No respiratory distress.  Neurological:     Mental Status: She is alert and oriented to person, place, and time.  Psychiatric:        Mood and Affect: Mood normal.        Behavior: Behavior normal.         Assessment/Plan: 1. Primary hypertension (Primary) Continue telmisartan-hydrochlorothiazide as prescribed.   2. Mixed hyperlipidemia Continue pravastatin as prescribed, refills ordered  - pravastatin (PRAVACHOL) 10 MG tablet; Take 1 tablet (10 mg total) by mouth daily.  Dispense: 30 tablet; Refill: 3  3. Neuropathy of both feet Bedtime gabapentin dose increased, follow up in 1 month - gabapentin (NEURONTIN) 100 MG capsule; Take 1 capsule (100 mg total) by mouth daily with breakfast.  Dispense: 30 capsule; Refill: 5 - gabapentin (NEURONTIN) 300 MG capsule; Take 1 capsule (300 mg total) by mouth at bedtime.  Dispense: 30 capsule; Refill: 5  4. Morbid obesity (HCC) Zepbound prescribed, will wait for prior authorization request and complete it asap. Follow up in a month  - tirzepatide (ZEPBOUND) 5 MG/0.5ML  Pen; Inject 5 mg into the skin once a week.  Dispense: 2 mL; Refill: 5 - tirzepatide (ZEPBOUND) 2.5 MG/0.5ML Pen; Inject 2.5 mg into the skin once a week. For 4 doses, then increase to 5 mg weekly  Dispense: 2 mL; Refill: 0   General Counseling: Yvonne Davis verbalizes understanding of the findings of todays visit and agrees with plan of treatment. I have discussed any further diagnostic evaluation that may be needed or ordered today. We also reviewed her medications today. she has been encouraged to call the office with any questions or concerns that should arise related to todays visit.    No orders of the defined types were placed in this encounter.   Meds ordered this encounter  Medications   tirzepatide (ZEPBOUND) 5 MG/0.5ML Pen    Sig: Inject 5 mg into the skin once a week.    Dispense:  2 mL    Refill:  5    Send prior authorization request asap. Patient will start 5 mg dose after 4 doses of the 2.5 mg.   tirzepatide (ZEPBOUND) 2.5 MG/0.5ML Pen    Sig: Inject 2.5 mg into the skin once a week. For 4 doses, then increase to 5 mg weekly    Dispense:  2 mL    Refill:  0     Will have 2.5 mg dose for first 4 weeks then increase to the 5 mg dose weekly. Will need prior authorization, please send the request asap. E66.01   gabapentin (NEURONTIN) 100 MG capsule    Sig: Take 1 capsule (100 mg total) by mouth daily with breakfast.    Dispense:  30 capsule    Refill:  5    Note decreased frequency and patient also has a new bedtime gabapentin script   gabapentin (NEURONTIN) 300 MG capsule    Sig: Take 1 capsule (300 mg total) by mouth at bedtime.    Dispense:  30 capsule    Refill:  5    New script fill today, this is to be taken in addition to the morning gabapentin dose.   pravastatin (PRAVACHOL) 10 MG tablet    Sig: Take 1 tablet (10 mg total) by mouth daily.    Dispense:  30 tablet    Refill:  3    For future refills.    Return in about 1 month (around 02/12/2024) for F/U, Zerah Hilyer PCP, Weight loss.   Total time spent:30 Minutes Time spent includes review of chart, medications, test results, and follow up plan with the patient.   [place] Controlled Substance Database was reviewed by me.  This patient was seen by Sallyanne Kuster, FNP-C in collaboration with Dr. Beverely Risen as a part of collaborative care agreement.   Macon Sandiford R. Tedd Sias, MSN, FNP-C Internal medicine

## 2024-01-21 ENCOUNTER — Telehealth: Payer: Self-pay

## 2024-02-04 MED ORDER — TIRZEPATIDE-WEIGHT MANAGEMENT 5 MG/0.5ML ~~LOC~~ SOAJ
5.0000 mg | SUBCUTANEOUS | 5 refills | Status: DC
Start: 2024-02-04 — End: 2024-04-10

## 2024-02-04 NOTE — Telephone Encounter (Signed)
Patient notified

## 2024-02-14 ENCOUNTER — Encounter: Payer: Self-pay | Admitting: Nurse Practitioner

## 2024-02-14 ENCOUNTER — Ambulatory Visit (INDEPENDENT_AMBULATORY_CARE_PROVIDER_SITE_OTHER): Payer: BC Managed Care – PPO | Admitting: Nurse Practitioner

## 2024-02-14 VITALS — BP 132/80 | HR 88 | Temp 98.5°F | Resp 16 | Ht 66.0 in | Wt 251.4 lb

## 2024-02-14 DIAGNOSIS — I10 Essential (primary) hypertension: Secondary | ICD-10-CM | POA: Diagnosis not present

## 2024-02-14 DIAGNOSIS — E782 Mixed hyperlipidemia: Secondary | ICD-10-CM | POA: Diagnosis not present

## 2024-02-14 DIAGNOSIS — E66813 Obesity, class 3: Secondary | ICD-10-CM | POA: Diagnosis not present

## 2024-02-14 DIAGNOSIS — Z6841 Body Mass Index (BMI) 40.0 and over, adult: Secondary | ICD-10-CM

## 2024-02-14 MED ORDER — TELMISARTAN-HCTZ 40-12.5 MG PO TABS: 1.0000 | ORAL_TABLET | Freq: Every day | ORAL | 5 refills | Status: DC

## 2024-02-14 NOTE — Progress Notes (Signed)
 [facility] [address] [place], [place] [address]  Internal MEDICINE  Office Visit Note  Patient Name: Yvonne Davis  [id number]  [id number]  Date of Service: 02/14/2024  Chief Complaint  Patient presents with   Hypertension   Follow-up    Weight loss     HPI Xan presents for a follow-up visit for hypertension, weight loss, and high cholesterol.  Hypertension -- BP controlled with current medication Weight loss -- waiting for zepbound to be approved, prior authorization sent today High cholesterol -- taking pravastatin.   Current Medication: Outpatient Encounter Medications as of 02/14/2024  Medication Sig   cyclobenzaprine (FLEXERIL) 10 MG tablet TAKE 1 TABLET(10 MG) BY MOUTH AT BEDTIME AS NEEDED FOR MUSCLE SPASMS   gabapentin (NEURONTIN) 100 MG capsule Take 1 capsule (100 mg total) by mouth daily with breakfast.   gabapentin (NEURONTIN) 300 MG capsule Take 1 capsule (300 mg total) by mouth at bedtime.   pravastatin (PRAVACHOL) 10 MG tablet Take 1 tablet (10 mg total) by mouth daily.   tirzepatide (ZEPBOUND) 2.5 MG/0.5ML Pen Inject 2.5 mg into the skin once a week. For 4 doses, then increase to 5 mg weekly   tirzepatide (ZEPBOUND) 5 MG/0.5ML Pen Inject 5 mg into the skin once a week.   Vitamin D, Ergocalciferol, (DRISDOL) 1.25 MG (50000 UNIT) CAPS capsule Take 1 capsule (50,000 Units total) by mouth every 7 (seven) days.   [DISCONTINUED] escitalopram (LEXAPRO) 5 MG tablet Take 1 tablet (5 mg total) by mouth daily.   [DISCONTINUED] telmisartan-hydrochlorothiazide (MICARDIS HCT) 40-12.5 MG tablet TAKE 1 TABLET BY MOUTH DAILY   telmisartan-hydrochlorothiazide (MICARDIS HCT) 40-12.5 MG tablet Take 1 tablet by mouth daily.   No facility-administered encounter medications on file as of 02/14/2024.    Surgical History: Past Surgical History:  Procedure Laterality Date   ABDOMINAL HYSTERECTOMY     BREAST BIOPSY Left 03/07/2019   left bc lymp node US bx  LYMPH  NODE NEGATIVE FOR MORPHOLOGIC FEATURES OF A NEOPLASTIC PROCESS.   BREAST BIOPSY Left 04/27/2023   Stereo bx, Coil Clip, Path pending   BREAST BIOPSY Left 04/27/2023   MM LT BREAST BX W LOC DEV 1ST LESION IMAGE BX SPEC STEREO GUIDE 04/27/2023 [facility]    Medical History: Past Medical History:  Diagnosis Date   Heart palpitations    Hypertension     Family History: Family History  Problem Relation Age of Onset   Cancer Father    Breast cancer Maternal Uncle 65   Breast cancer Maternal Grandmother 59    Social History   Socioeconomic History   Marital status: Single    Spouse name: Not on file   Number of children: Not on file   Years of education: Not on file   Highest education level: Not on file  Occupational History   Not on file  Tobacco Use   Smoking status: Every Day    Current packs/day: 1.00    Types: Cigarettes   Smokeless tobacco: Never  Substance and Sexual Activity   Alcohol use: No   Drug use: Never   Sexual activity: Yes  Other Topics Concern   Not on file  Social History Narrative   Not on file   Social Drivers of Health   Financial Resource Strain: Not on file  Food Insecurity: Not on file  Transportation Needs: Not on file  Physical Activity: Not on file  Stress: Not on file  Social Connections: Not on file  Intimate Partner Violence: Not on  file      Review of Systems  Constitutional:  Positive for fatigue and unexpected weight change. Negative for chills.  HENT:  Negative for congestion, postnasal drip, rhinorrhea, sneezing and sore throat.   Eyes:  Negative for redness.  Respiratory: Negative.  Negative for cough, chest tightness, shortness of breath and wheezing.   Cardiovascular: Negative.  Negative for chest pain and palpitations.  Gastrointestinal: Negative.  Negative for abdominal pain, constipation, diarrhea, nausea and vomiting.  Endocrine: Negative for polyphagia.  Genitourinary:  Negative for dysuria and frequency.   Musculoskeletal:  Positive for arthralgias (right elbow, right hand), back pain and gait problem. Negative for joint swelling and neck pain.       Feet hurt  Skin:  Negative for rash.  Neurological:  Positive for dizziness. Negative for tremors and numbness.  Psychiatric/Behavioral:  Negative for behavioral problems (Depression), sleep disturbance and suicidal ideas. The patient is not nervous/anxious.     Vital Signs: BP 132/80   Pulse 88   Temp 98.5 F (36.9 C)   Resp 16   Ht 5\' 6"  (1.676 m)   Wt 251 lb 6.4 oz (114 kg)   SpO2 98%   BMI 40.58 kg/m    Physical Exam Vitals reviewed.  Constitutional:      General: She is not in acute distress.    Appearance: Normal appearance. She is obese. She is not ill-appearing.  HENT:     Head: Normocephalic and atraumatic.  Eyes:     Pupils: Pupils are equal, round, and reactive to light.  Cardiovascular:     Rate and Rhythm: Normal rate and regular rhythm.  Pulmonary:     Effort: Pulmonary effort is normal. No respiratory distress.  Neurological:     Mental Status: She is alert and oriented to person, place, and time.  Psychiatric:        Mood and Affect: Mood normal.        Behavior: Behavior normal.        Assessment/Plan: 1. Primary hypertension (Primary) Stable, Continue micardis as prescribed.  - telmisartan-hydrochlorothiazide (MICARDIS HCT) 40-12.5 MG tablet; Take 1 tablet by mouth daily.  Dispense: 30 tablet; Refill: 5  2. Mixed hyperlipidemia Continue pravastatin as prescribed.   3. Class 3 severe obesity due to excess calories with serious comorbidity and body mass index (BMI) of 40.0 to 44.9 in adult [facility]) Zepbound prescribed, waiting for prior authorization approval.    General Counseling: Cheryn verbalizes understanding of the findings of todays visit and agrees with plan of treatment. I have discussed any further diagnostic evaluation that may be needed or ordered today. We also reviewed her medications  today. she has been encouraged to call the office with any questions or concerns that should arise related to todays visit.    No orders of the defined types were placed in this encounter.   Meds ordered this encounter  Medications   telmisartan-hydrochlorothiazide (MICARDIS HCT) 40-12.5 MG tablet    Sig: Take 1 tablet by mouth daily.    Dispense:  30 tablet    Refill:  5    Return in about 2 months (around 04/13/2024) for F/U, Weight loss, Ronnald Shedden PCP.   Total time spent:30 Minutes Time spent includes review of chart, medications, test results, and follow up plan with the patient.   [place] Controlled Substance Database was reviewed by me.  This patient was seen by Sallyanne Kuster, FNP-C in collaboration with Dr. Beverely Risen as a part of collaborative care agreement.  Dashanna Kinnamon R. Tedd Sias, MSN, FNP-C Internal medicine

## 2024-02-21 ENCOUNTER — Telehealth: Payer: Self-pay

## 2024-02-21 DIAGNOSIS — M5440 Lumbago with sciatica, unspecified side: Secondary | ICD-10-CM | POA: Diagnosis not present

## 2024-02-21 DIAGNOSIS — R200 Anesthesia of skin: Secondary | ICD-10-CM | POA: Diagnosis not present

## 2024-02-21 DIAGNOSIS — R208 Other disturbances of skin sensation: Secondary | ICD-10-CM | POA: Diagnosis not present

## 2024-02-21 DIAGNOSIS — M79604 Pain in right leg: Secondary | ICD-10-CM | POA: Diagnosis not present

## 2024-03-20 ENCOUNTER — Telehealth: Payer: Self-pay

## 2024-03-20 NOTE — Telephone Encounter (Signed)
 Patient called upset saying she called up her 3-4 weeks ago to give a number to Alyssa for her to call about getting the Zepbound approved for her and she is still waiting and stated she is just probably going to have to find a new doctor. And wouldn't give me the number so I can give it to Alyssa.

## 2024-03-26 ENCOUNTER — Encounter: Payer: Self-pay | Admitting: Nurse Practitioner

## 2024-04-10 ENCOUNTER — Ambulatory Visit (INDEPENDENT_AMBULATORY_CARE_PROVIDER_SITE_OTHER): Payer: BC Managed Care – PPO | Admitting: Nurse Practitioner

## 2024-04-10 ENCOUNTER — Encounter: Payer: Self-pay | Admitting: Nurse Practitioner

## 2024-04-10 VITALS — BP 128/80 | HR 91 | Temp 98.4°F | Resp 16 | Ht 66.0 in | Wt 257.6 lb

## 2024-04-10 DIAGNOSIS — I10 Essential (primary) hypertension: Secondary | ICD-10-CM | POA: Diagnosis not present

## 2024-04-10 DIAGNOSIS — E782 Mixed hyperlipidemia: Secondary | ICD-10-CM

## 2024-04-10 DIAGNOSIS — M7701 Medial epicondylitis, right elbow: Secondary | ICD-10-CM

## 2024-04-10 DIAGNOSIS — Z6841 Body Mass Index (BMI) 40.0 and over, adult: Secondary | ICD-10-CM

## 2024-04-10 DIAGNOSIS — E66813 Obesity, class 3: Secondary | ICD-10-CM

## 2024-04-10 MED ORDER — ZEPBOUND 5 MG/0.5ML ~~LOC~~ SOAJ: 5.0000 mg | SUBCUTANEOUS | 5 refills | Status: DC

## 2024-04-10 MED ORDER — PRAVASTATIN SODIUM 10 MG PO TABS: 10.0000 mg | ORAL_TABLET | Freq: Every day | ORAL | 3 refills | Status: DC

## 2024-04-10 MED ORDER — CYCLOBENZAPRINE HCL 10 MG PO TABS: ORAL_TABLET | ORAL | 1 refills | Status: DC

## 2024-04-10 NOTE — Telephone Encounter (Signed)
 Patient seen in office today.

## 2024-04-10 NOTE — Progress Notes (Signed)
 [facility] [address] [place], [place] [address]  Internal MEDICINE  Office Visit Note  Patient Name: Yvonne Davis  [id number]  [id number]  Date of Service: 04/10/2024  Chief Complaint  Patient presents with   Hypertension   Follow-up    Zepbound  medicine     HPI Yvonne Davis presents for a follow-up visit for hypertension, high cholesterol, neuropathy, and weight loss.  Weight loss -- has not been able to get the medication yet, states [facility] needs more information.  Neuropathy -- seen by neurology and gabapentin  dose was increased, found a neuropathy doctor that she may try to see.  High cholesterol -- taking pravastatin  Hypertension -- controlled with telmisartan -hydrochlorothiazide     Current Medication: Outpatient Encounter Medications as of 04/10/2024  Medication Sig   gabapentin  (NEURONTIN ) 100 MG capsule Take 1 capsule (100 mg total) by mouth daily with breakfast.   gabapentin  (NEURONTIN ) 300 MG capsule Take 1 capsule (300 mg total) by mouth at bedtime.   telmisartan -hydrochlorothiazide  (MICARDIS  HCT) 40-12.5 MG tablet Take 1 tablet by mouth daily.   tirzepatide  (ZEPBOUND ) 5 MG/0.5ML Pen Inject 5 mg into the skin once a week.   Vitamin D , Ergocalciferol , (DRISDOL ) 1.25 MG (50000 UNIT) CAPS capsule Take 1 capsule (50,000 Units total) by mouth every 7 (seven) days.   [DISCONTINUED] cyclobenzaprine  (FLEXERIL ) 10 MG tablet TAKE 1 TABLET(10 MG) BY MOUTH AT BEDTIME AS NEEDED FOR MUSCLE SPASMS   [DISCONTINUED] pravastatin  (PRAVACHOL ) 10 MG tablet Take 1 tablet (10 mg total) by mouth daily.   [DISCONTINUED] tirzepatide  (ZEPBOUND ) 2.5 MG/0.5ML Pen Inject 2.5 mg into the skin once a week. For 4 doses, then increase to 5 mg weekly   [DISCONTINUED] tirzepatide  (ZEPBOUND ) 5 MG/0.5ML Pen Inject 5 mg into the skin once a week.   cyclobenzaprine  (FLEXERIL ) 10 MG tablet TAKE 1 TABLET(10 MG) BY MOUTH AT BEDTIME AS NEEDED FOR MUSCLE SPASMS   pravastatin  (PRAVACHOL ) 10  MG tablet Take 1 tablet (10 mg total) by mouth daily.   No facility-administered encounter medications on file as of 04/10/2024.    Surgical History: Past Surgical History:  Procedure Laterality Date   ABDOMINAL HYSTERECTOMY     BREAST BIOPSY Left 03/07/2019   left bc lymp node us  bx  LYMPH NODE NEGATIVE FOR MORPHOLOGIC FEATURES OF A NEOPLASTIC PROCESS.   BREAST BIOPSY Left 04/27/2023   Stereo bx, Coil Clip, Path pending   BREAST BIOPSY Left 04/27/2023   MM LT BREAST BX W LOC DEV 1ST LESION IMAGE BX SPEC STEREO GUIDE 04/27/2023 [facility]    Medical History: Past Medical History:  Diagnosis Date   Heart palpitations    Hypertension     Family History: Family History  Problem Relation Age of Onset   Cancer Father    Breast cancer Maternal Uncle 49   Breast cancer Maternal Grandmother 53    Social History   Socioeconomic History   Marital status: Single    Spouse name: Not on file   Number of children: Not on file   Years of education: Not on file   Highest education level: Not on file  Occupational History   Not on file  Tobacco Use   Smoking status: Former    Current packs/day: 1.00    Types: Cigarettes   Smokeless tobacco: Never   Tobacco comments:    Quit 2 weeks ago.   Substance and Sexual Activity   Alcohol use: No   Drug use: Never   Sexual activity: Yes  Other Topics Concern  Not on file  Social History Narrative   Not on file   Social Drivers of Health   Financial Resource Strain: Medium Risk (02/21/2024)   Received [facility]   Overall Financial Resource Strain (CARDIA)    Difficulty of Paying Living Expenses: Somewhat hard  Food Insecurity: No Food Insecurity (02/21/2024)   Received from [facility]   Hunger Vital Sign    Worried About Running Out of Food in the Last Year: Never true    Ran Out of Food in the Last Year: Never true  Transportation Needs: No Transportation Needs (02/21/2024)   Received  from [facility] - Transportation    In the past 12 months, has lack of transportation kept you from medical appointments or from getting medications?: No    Lack of Transportation (Non-Medical): No  Physical Activity: Not on file  Stress: Not on file  Social Connections: Not on file  Intimate Partner Violence: Not on file      Review of Systems  Constitutional:  Positive for fatigue and unexpected weight change. Negative for chills.  HENT:  Negative for congestion, postnasal drip, rhinorrhea, sneezing and sore throat.   Eyes:  Negative for redness.  Respiratory: Negative.  Negative for cough, chest tightness, shortness of breath and wheezing.   Cardiovascular: Negative.  Negative for chest pain and palpitations.  Gastrointestinal: Negative.  Negative for abdominal pain, constipation, diarrhea, nausea and vomiting.  Endocrine: Negative for polyphagia.  Genitourinary:  Negative for dysuria and frequency.  Musculoskeletal:  Positive for arthralgias (right elbow, right hand), back pain and gait problem. Negative for joint swelling and neck pain.       Feet hurt  Skin:  Negative for rash.  Neurological:  Positive for dizziness. Negative for tremors and numbness.  Psychiatric/Behavioral:  Negative for behavioral problems (Depression), sleep disturbance and suicidal ideas. The patient is not nervous/anxious.     Vital Signs: BP 128/80   Pulse 91   Temp 98.4 F (36.9 C)   Resp 16   Ht 5\' 6"  (1.676 m)   Wt 257 lb 9.6 oz (116.8 kg)   SpO2 98%   BMI 41.58 kg/m    Physical Exam Vitals reviewed.  Constitutional:      General: She is not in acute distress.    Appearance: Normal appearance. She is obese. She is not ill-appearing.  HENT:     Head: Normocephalic and atraumatic.  Eyes:     Pupils: Pupils are equal, round, and reactive to light.  Cardiovascular:     Rate and Rhythm: Normal rate and regular rhythm.  Pulmonary:     Effort: Pulmonary effort  is normal. No respiratory distress.  Skin:    Capillary Refill: Capillary refill takes less than 2 seconds.  Neurological:     Mental Status: She is alert and oriented to person, place, and time.  Psychiatric:        Mood and Affect: Mood normal.        Behavior: Behavior normal.        Assessment/Plan: 1. Primary hypertension (Primary) Stable, continue telmisartan -hydrochlorothiazide  as prescribed.   2. Mixed hyperlipidemia Continue pravastatin  as prescribed.  - pravastatin  (PRAVACHOL ) 10 MG tablet; Take 1 tablet (10 mg total) by mouth daily.  Dispense: 30 tablet; Refill: 3  3. Epicondylitis elbow, medial, right May take cyclobenzaprine  as needed.  - cyclobenzaprine  (FLEXERIL ) 10 MG tablet; TAKE 1 TABLET(10 MG) BY MOUTH AT BEDTIME AS NEEDED FOR  MUSCLE SPASMS  Dispense: 90 tablet; Refill: 1  4. Class 3 severe obesity due to excess calories with serious comorbidity and body mass index (BMI) of 40.0 to 44.9 in adult Reordered zepbound  for patient - tirzepatide  (ZEPBOUND ) 5 MG/0.5ML Pen; Inject 5 mg into the skin once a week.  Dispense: 2 mL; Refill: 5   General Counseling: Beula verbalizes understanding of the findings of todays visit and agrees with plan of treatment. I have discussed any further diagnostic evaluation that may be needed or ordered today. We also reviewed her medications today. she has been encouraged to call the office with any questions or concerns that should arise related to todays visit.    No orders of the defined types were placed in this encounter.   Meds ordered this encounter  Medications   tirzepatide  (ZEPBOUND ) 5 MG/0.5ML Pen    Sig: Inject 5 mg into the skin once a week.    Dispense:  2 mL    Refill:  5    Dx code E66.01, BMI 41.58   cyclobenzaprine  (FLEXERIL ) 10 MG tablet    Sig: TAKE 1 TABLET(10 MG) BY MOUTH AT BEDTIME AS NEEDED FOR MUSCLE SPASMS    Dispense:  90 tablet    Refill:  1   pravastatin  (PRAVACHOL ) 10 MG tablet    Sig: Take 1  tablet (10 mg total) by mouth daily.    Dispense:  30 tablet    Refill:  3    For future refills.    Return in about 4 weeks (around 05/08/2024) for F/U, eval new med, Patti Shorb PCP zepbound .   Total time spent:30 Minutes Time spent includes review of chart, medications, test results, and follow up plan with the patient.   [place] Controlled Substance Database was reviewed by me.  This patient was seen by Laurence Pons, FNP-C in collaboration with Dr. Verneta Gone as a part of collaborative care agreement.   Linzi Ohlinger R. Bobbi Burow, MSN, FNP-C Internal medicine

## 2024-04-12 ENCOUNTER — Telehealth: Payer: Self-pay

## 2024-04-12 NOTE — Telephone Encounter (Signed)
Sent message to [employer]

## 2024-04-14 ENCOUNTER — Other Ambulatory Visit: Payer: Self-pay | Admitting: Nurse Practitioner

## 2024-04-14 DIAGNOSIS — I10 Essential (primary) hypertension: Secondary | ICD-10-CM

## 2024-05-04 ENCOUNTER — Telehealth: Payer: Self-pay

## 2024-05-04 NOTE — Telephone Encounter (Signed)
 Tried to call patient back as she called wanting to speak to me, left message for patient to give me call back.

## 2024-05-08 ENCOUNTER — Ambulatory Visit (INDEPENDENT_AMBULATORY_CARE_PROVIDER_SITE_OTHER): Payer: BC Managed Care – PPO | Admitting: Nurse Practitioner

## 2024-05-08 ENCOUNTER — Encounter: Payer: Self-pay | Admitting: Nurse Practitioner

## 2024-05-08 VITALS — BP 138/82 | HR 89 | Temp 98.0°F | Resp 16 | Ht 66.0 in | Wt 255.0 lb

## 2024-05-08 DIAGNOSIS — Z0001 Encounter for general adult medical examination with abnormal findings: Secondary | ICD-10-CM

## 2024-05-08 DIAGNOSIS — G5793 Unspecified mononeuropathy of bilateral lower limbs: Secondary | ICD-10-CM

## 2024-05-08 DIAGNOSIS — E782 Mixed hyperlipidemia: Secondary | ICD-10-CM

## 2024-05-08 DIAGNOSIS — E559 Vitamin D deficiency, unspecified: Secondary | ICD-10-CM

## 2024-05-08 DIAGNOSIS — R5383 Other fatigue: Secondary | ICD-10-CM | POA: Diagnosis not present

## 2024-05-08 DIAGNOSIS — R7303 Prediabetes: Secondary | ICD-10-CM

## 2024-05-08 DIAGNOSIS — Z1211 Encounter for screening for malignant neoplasm of colon: Secondary | ICD-10-CM

## 2024-05-08 DIAGNOSIS — Z1212 Encounter for screening for malignant neoplasm of rectum: Secondary | ICD-10-CM

## 2024-05-08 DIAGNOSIS — E538 Deficiency of other specified B group vitamins: Secondary | ICD-10-CM

## 2024-05-08 DIAGNOSIS — Z1231 Encounter for screening mammogram for malignant neoplasm of breast: Secondary | ICD-10-CM

## 2024-05-08 MED ORDER — GABAPENTIN 300 MG PO CAPS: 300.0000 mg | ORAL_CAPSULE | Freq: Every day | ORAL | 5 refills | Status: DC

## 2024-05-08 MED ORDER — GABAPENTIN 100 MG PO CAPS: 100.0000 mg | ORAL_CAPSULE | Freq: Every day | ORAL | 5 refills | Status: DC

## 2024-05-08 NOTE — Progress Notes (Signed)
 [facility] [address] [place], [place] [address]  Internal MEDICINE  Office Visit Note  Patient Name: Yvonne Davis  [id number]  [id number]  Date of Service: 05/08/2024  Chief Complaint  Patient presents with   Annual Exam   Quality Metric Gaps    colonoscopy    HPI Yvonne Davis presents for an annual well visit and physical exam.  Well-appearing 43 y.o. female with hypertension, neuropathy, prediabetes, anxiety, obesity  Routine CRC screening: opt for cologuard  Routine mammogram: due this month  DEXA scan: not due yet Pap smear: due in 2029 Labs: routine labs due now  New or worsening pain: none  Other concerns: zepbound  PA was denied, will look into appeal.     Current Medication: Outpatient Encounter Medications as of 05/08/2024  Medication Sig   cyclobenzaprine  (FLEXERIL ) 10 MG tablet TAKE 1 TABLET(10 MG) BY MOUTH AT BEDTIME AS NEEDED FOR MUSCLE SPASMS   pravastatin  (PRAVACHOL ) 10 MG tablet Take 1 tablet (10 mg total) by mouth daily.   telmisartan -hydrochlorothiazide  (MICARDIS  HCT) 40-12.5 MG tablet TAKE 1 TABLET BY MOUTH DAILY   tirzepatide  (ZEPBOUND ) 5 MG/0.5ML Pen Inject 5 mg into the skin once a week.   Vitamin D , Ergocalciferol , (DRISDOL ) 1.25 MG (50000 UNIT) CAPS capsule Take 1 capsule (50,000 Units total) by mouth every 7 (seven) days.   [DISCONTINUED] gabapentin  (NEURONTIN ) 100 MG capsule Take 1 capsule (100 mg total) by mouth daily with breakfast.   [DISCONTINUED] gabapentin  (NEURONTIN ) 300 MG capsule Take 1 capsule (300 mg total) by mouth at bedtime.   gabapentin  (NEURONTIN ) 100 MG capsule Take 1 capsule (100 mg total) by mouth daily with breakfast.   gabapentin  (NEURONTIN ) 300 MG capsule Take 1 capsule (300 mg total) by mouth at bedtime.   No facility-administered encounter medications on file as of 05/08/2024.    Surgical History: Past Surgical History:  Procedure Laterality Date   ABDOMINAL HYSTERECTOMY     BREAST BIOPSY Left 03/07/2019    left bc lymp node us  bx  LYMPH NODE NEGATIVE FOR MORPHOLOGIC FEATURES OF A NEOPLASTIC PROCESS.   BREAST BIOPSY Left 04/27/2023   Stereo bx, Coil Clip, Path pending   BREAST BIOPSY Left 04/27/2023   MM LT BREAST BX W LOC DEV 1ST LESION IMAGE BX SPEC STEREO GUIDE 04/27/2023 [facility]    Medical History: Past Medical History:  Diagnosis Date   Heart palpitations    Hypertension     Family History: Family History  Problem Relation Age of Onset   Cancer Father    Breast cancer Maternal Uncle 89   Breast cancer Maternal Grandmother 2    Social History   Socioeconomic History   Marital status: Single    Spouse name: Not on file   Number of children: Not on file   Years of education: Not on file   Highest education level: Not on file  Occupational History   Not on file  Tobacco Use   Smoking status: Former    Current packs/day: 1.00    Types: Cigarettes   Smokeless tobacco: Never   Tobacco comments:    Quit 2 weeks ago.   Substance and Sexual Activity   Alcohol use: No   Drug use: Never   Sexual activity: Yes  Other Topics Concern   Not on file  Social History Narrative   Not on file   Social Drivers of Health   Financial Resource Strain: Medium Risk (02/21/2024)   Received from [facility]   Overall Financial Resource  Strain (CARDIA)    Difficulty of Paying Living Expenses: Somewhat hard  Food Insecurity: No Food Insecurity (02/21/2024)   Received from [facility]   Hunger Vital Sign    Worried About Running Out of Food in the Last Year: Never true    Ran Out of Food in the Last Year: Never true  Transportation Needs: No Transportation Needs (02/21/2024)   Received [facility]    In the past 12 months, has lack of transportation kept you from medical appointments or from getting medications?: No    Lack of Transportation (Non-Medical): No  Physical Activity: Not on file   Stress: Not on file  Social Connections: Not on file  Intimate Partner Violence: Not on file      Review of Systems  Constitutional:  Positive for fatigue and unexpected weight change. Negative for activity change, appetite change, chills and fever.  HENT:  Positive for postnasal drip. Negative for congestion, ear pain, rhinorrhea, sneezing, sore throat and trouble swallowing.   Eyes: Negative.  Negative for redness.  Respiratory: Negative.  Negative for cough, chest tightness, shortness of breath and wheezing.   Cardiovascular: Negative.  Negative for chest pain and palpitations.  Gastrointestinal: Negative.  Negative for abdominal pain, blood in stool, constipation, diarrhea, nausea and vomiting.  Endocrine: Negative for polyphagia and polyuria.  Genitourinary: Negative.  Negative for difficulty urinating, dysuria, frequency, hematuria and urgency.  Musculoskeletal:  Positive for arthralgias (right elbow, right hand). Negative for back pain, joint swelling, myalgias and neck pain.       Feet hurt  Skin: Negative.  Negative for rash and wound.  Allergic/Immunologic: Negative.  Negative for immunocompromised state.  Neurological: Negative.  Negative for dizziness, tremors, seizures, numbness and headaches.  Hematological: Negative.   Psychiatric/Behavioral: Negative.  Negative for behavioral problems (Depression), self-injury, sleep disturbance and suicidal ideas. The patient is not nervous/anxious.     Vital Signs: BP 138/82   Pulse 89   Temp 98 F (36.7 C)   Resp 16   Ht 5\' 6"  (1.676 m)   Wt 255 lb (115.7 kg)   SpO2 95%   BMI 41.16 kg/m    Physical Exam Vitals reviewed.  Constitutional:      General: She is not in acute distress.    Appearance: Normal appearance. She is well-developed. She is obese. She is not ill-appearing or diaphoretic.  HENT:     Head: Normocephalic and atraumatic.     Right Ear: Tympanic membrane, ear canal and external ear normal. There is no  impacted cerumen.     Left Ear: Tympanic membrane, ear canal and external ear normal. There is no impacted cerumen.     Nose: Nose normal. No congestion or rhinorrhea.     Mouth/Throat:     Mouth: Mucous membranes are moist.     Pharynx: Oropharynx is clear. No oropharyngeal exudate or posterior oropharyngeal erythema.  Eyes:     General: No scleral icterus.       Right eye: No discharge.        Left eye: No discharge.     Extraocular Movements: Extraocular movements intact.     Conjunctiva/sclera: Conjunctivae normal.     Pupils: Pupils are equal, round, and reactive to light.  Neck:     Thyroid : No thyromegaly.     Vascular: No JVD.     Trachea: No tracheal deviation.  Cardiovascular:     Rate and Rhythm: Normal rate  and regular rhythm.     Pulses: Normal pulses.     Heart sounds: Normal heart sounds. No murmur heard.    No friction rub. No gallop.  Pulmonary:     Effort: Pulmonary effort is normal. No respiratory distress.     Breath sounds: Normal breath sounds. No stridor. No wheezing or rales.  Chest:     Chest wall: No tenderness.  Abdominal:     General: Bowel sounds are normal. There is no distension.     Palpations: Abdomen is soft. There is no mass.     Tenderness: There is no abdominal tenderness. There is no guarding or rebound.     Hernia: There is no hernia in the left inguinal area or right inguinal area.  Genitourinary:    General: Normal vulva.     Exam position: Lithotomy position.     Pubic Area: No rash or pubic lice.      Labia:        Right: No rash, tenderness, lesion or injury.        Left: No rash, tenderness, lesion or injury.      Urethra: No prolapse, urethral swelling or urethral lesion.     Vagina: Normal. No signs of injury and foreign body. No vaginal discharge, erythema, tenderness, bleeding, lesions or prolapsed vaginal walls.     Cervix: No cervical motion tenderness, discharge, friability, lesion, erythema, cervical bleeding or eversion.      Uterus: Normal. Not deviated, not enlarged, not fixed, not tender and no uterine prolapse.      Adnexa: Right adnexa normal.       Right: No mass, tenderness or fullness.         Left: No mass, tenderness or fullness.       Rectum: Normal. No mass, anal fissure or external hemorrhoid.  Musculoskeletal:        General: No tenderness or deformity. Normal range of motion.     Cervical back: Normal range of motion and neck supple.     Right lower leg: No edema.     Left lower leg: No edema.  Lymphadenopathy:     Cervical: No cervical adenopathy.     Lower Body: No right inguinal adenopathy. No left inguinal adenopathy.  Skin:    General: Skin is warm and dry.     Capillary Refill: Capillary refill takes less than 2 seconds.     Coloration: Skin is not pale.     Findings: No erythema or rash.  Neurological:     Mental Status: She is alert and oriented to person, place, and time.     Cranial Nerves: No cranial nerve deficit.     Motor: No abnormal muscle tone.     Coordination: Coordination normal.     Gait: Gait normal.     Deep Tendon Reflexes: Reflexes are normal and symmetric.  Psychiatric:        Mood and Affect: Mood normal.        Behavior: Behavior normal.        Thought Content: Thought content normal.        Judgment: Judgment normal.        Assessment/Plan: 1. Encounter for routine adult health examination with abnormal findings (Primary) Age-appropriate preventive screenings and vaccinations discussed, annual physical exam completed. Routine labs for health maintenance ordered, see below. PHM updated.   - CBC with Differential/Platelet - CMP14+EGFR - Lipid Profile - Hgb A1C w/o eAG - B12 and Folate Panel - Vitamin  D (25 hydroxy) - TSH + free T4  2. Prediabetes Routine labs ordered  - CMP14+EGFR - Lipid Profile - Hgb A1C w/o eAG - TSH + free T4  3. Mixed hyperlipidemia Routine labs ordered  - CBC with Differential/Platelet - CMP14+EGFR - Lipid  Profile - Hgb A1C w/o eAG  4. Other fatigue Routine labs ordered  - CBC with Differential/Platelet - CMP14+EGFR - Lipid Profile - Hgb A1C w/o eAG - B12 and Folate Panel - Vitamin D  (25 hydroxy) - TSH + free T4  5. Neuropathy of both feet Routine labs ordered. Continue gabapentin  as prescribed.  - CMP14+EGFR - Hgb A1C w/o eAG - TSH + free T4 - gabapentin  (NEURONTIN ) 300 MG capsule; Take 1 capsule (300 mg total) by mouth at bedtime.  Dispense: 30 capsule; Refill: 5 - gabapentin  (NEURONTIN ) 100 MG capsule; Take 1 capsule (100 mg total) by mouth daily with breakfast.  Dispense: 30 capsule; Refill: 5  6. B12 deficiency Routine labs ordered  - CBC with Differential/Platelet - B12 and Folate Panel  7. Vitamin D  deficiency Routine lab ordered  - Vitamin D  (25 hydroxy)  8. Screening for colorectal cancer Cologuard test ordered  - Cologuard  9. Encounter for screening mammogram for malignant neoplasm of breast Routine mammogram ordered - MM 3D SCREENING MAMMOGRAM BILATERAL BREAST; Future     General Counseling: Yvonne Davis verbalizes understanding of the findings of todays visit and agrees with plan of treatment. I have discussed any further diagnostic evaluation that may be needed or ordered today. We also reviewed her medications today. she has been encouraged to call the office with any questions or concerns that should arise related to todays visit.    Orders Placed This Encounter  Procedures   MM 3D SCREENING MAMMOGRAM BILATERAL BREAST   Cologuard   CBC with Differential/Platelet   CMP14+EGFR   Lipid Profile   Hgb A1C w/o eAG   B12 and Folate Panel   Vitamin D  (25 hydroxy)   TSH + free T4    Meds ordered this encounter  Medications   gabapentin  (NEURONTIN ) 300 MG capsule    Sig: Take 1 capsule (300 mg total) by mouth at bedtime.    Dispense:  30 capsule    Refill:  5    New script fill today, this is to be taken in addition to the morning gabapentin  dose.    gabapentin  (NEURONTIN ) 100 MG capsule    Sig: Take 1 capsule (100 mg total) by mouth daily with breakfast.    Dispense:  30 capsule    Refill:  5    Note decreased frequency and patient also has a new bedtime gabapentin  script    Return in about 2 months (around 07/18/2024) for F/U, eval new med, Weight loss, Zymeir Salminen PCP.   Total time spent:30 Minutes Time spent includes review of chart, medications, test results, and follow up plan with the patient.   [place] Controlled Substance Database was reviewed by me.  This patient was seen by Laurence Pons, FNP-C in collaboration with Dr. Verneta Gone as a part of collaborative care agreement.  Urias Sheek R. Bobbi Burow, MSN, FNP-C Internal medicine

## 2024-05-09 ENCOUNTER — Encounter: Payer: Self-pay | Admitting: Nurse Practitioner

## 2024-05-09 DIAGNOSIS — R7303 Prediabetes: Secondary | ICD-10-CM | POA: Insufficient documentation

## 2024-05-12 ENCOUNTER — Encounter: Payer: Self-pay | Admitting: Nurse Practitioner

## 2024-06-09 ENCOUNTER — Telehealth: Payer: Self-pay

## 2024-06-12 MED ORDER — PHENTERMINE HCL 37.5 MG PO TABS: 37.5000 mg | ORAL_TABLET | Freq: Every day | ORAL | 1 refills | Status: DC

## 2024-06-12 NOTE — Telephone Encounter (Signed)
Pt notified that we sent med

## 2024-06-12 NOTE — Telephone Encounter (Signed)
 done

## 2024-06-16 ENCOUNTER — Other Ambulatory Visit: Payer: Self-pay | Admitting: Nurse Practitioner

## 2024-06-16 DIAGNOSIS — E559 Vitamin D deficiency, unspecified: Secondary | ICD-10-CM

## 2024-07-06 ENCOUNTER — Ambulatory Visit: Admitting: Nurse Practitioner

## 2024-07-06 NOTE — Progress Notes (Deleted)
 There were no vitals taken for this visit.   Subjective:    Patient ID: Yvonne Davis, female    DOB: 1981/11/09, 43 y.o.   MRN: [id number]  HPI: Yvonne Davis is a 43 y.o. female  No chief complaint on file.  Patient presents to clinic to establish care with new PCP.  Introduced to Publishing rights manager role and practice setting.  All questions answered.  Discussed provider/patient relationship and expectations.  Patient reports a history of ***. Patient denies a history of: Hypertension, Elevated Cholesterol, Diabetes, Thyroid  problems, Depression, Anxiety, Neurological problems, and Abdominal problems.   HYPERTENSION / HYPERLIPIDEMIA Satisfied with current treatment? {Blank single:19197::yes,no} Duration of hypertension: {Blank single:19197::chronic,months,years} BP monitoring frequency: {Blank single:19197::not checking,rarely,daily,weekly,monthly,a few times a day,a few times a week,a few times a month} BP range:  BP medication side effects: {Blank single:19197::yes,no} Past BP meds: {Blank multiple:19196::none,amlodipine,amlodipine/benazepril,atenolol,benazepril,benazepril/HCTZ,bisoprolol (bystolic),carvedilol,chlorthalidone,clonidine,diltiazem,exforge HCT,HCTZ,irbesartan (avapro),labetalol,lisinopril,lisinopril-HCTZ,losartan (cozaar),methyldopa,nifedipine,olmesartan (benicar),olmesartan-HCTZ,quinapril,ramipril,spironalactone,tekturna,valsartan,valsartan-HCTZ,verapamil} Duration of hyperlipidemia: {Blank single:19197::chronic,months,years} Cholesterol medication side effects: {Blank single:19197::yes,no} Cholesterol supplements: {Blank multiple:19196::none,fish oil,niacin,red yeast rice} Past cholesterol medications: {Blank multiple:19196::none,atorvastain (lipitor),lovastatin (mevacor),pravastatin  (pravachol ),rosuvastatin  (crestor ),simvastatin  (zocor),vytorin,fenofibrate (tricor),gemfibrozil,ezetimide (zetia),niaspan,lovaza} Medication compliance: {Blank single:19197::excellent compliance,good compliance,fair compliance,poor compliance} Aspirin: {Blank single:19197::yes,no} Recent stressors: {Blank single:19197::yes,no} Recurrent headaches: {Blank single:19197::yes,no} Visual changes: {Blank single:19197::yes,no} Palpitations: {Blank single:19197::yes,no} Dyspnea: {Blank single:19197::yes,no} Chest pain: {Blank single:19197::yes,no} Lower extremity edema: {Blank single:19197::yes,no} Dizzy/lightheaded: {Blank single:19197::yes,no}  Active Ambulatory Problems    Diagnosis Date Noted   Hypertension    Neuropathy of both feet 11/17/2023   Bilateral leg numbness 11/17/2023   Mixed hyperlipidemia 01/12/2024   Morbid obesity (HCC) 01/12/2024   Prediabetes 05/09/2024   Resolved Ambulatory Problems    Diagnosis Date Noted   No Resolved Ambulatory Problems   Past Medical History:  Diagnosis Date   Heart palpitations    Past Surgical History:  Procedure Laterality Date   ABDOMINAL HYSTERECTOMY     BREAST BIOPSY Left 03/07/2019   left bc lymp node us  bx  LYMPH NODE NEGATIVE FOR MORPHOLOGIC FEATURES OF A NEOPLASTIC PROCESS.   BREAST BIOPSY Left 04/27/2023   Stereo bx, Coil Clip, Path pending   BREAST BIOPSY Left 04/27/2023   MM LT BREAST BX W LOC DEV 1ST LESION IMAGE BX SPEC STEREO GUIDE 04/27/2023 [facility]   Family History  Problem Relation Age of Onset   Cancer Father    Breast cancer Maternal Uncle 42   Breast cancer Maternal Grandmother 90     Review of Systems  Per HPI unless specifically indicated above     Objective:    There were no vitals taken for this visit.  Wt Readings from Last 3 Encounters:  05/08/24 255 lb (115.7 kg)  04/10/24 257 lb 9.6 oz (116.8 kg)  02/14/24 251 lb 6.4 oz (114 kg)    Physical Exam  Results for orders placed or  performed in visit on 09/08/23  Estradiol    Collection Time: 09/10/23  8:52 AM  Result Value Ref Range   Estradiol  30.1 pg/mL  FSH/LH   Collection Time: 09/10/23  8:52 AM  Result Value Ref Range   LH 3.8 mIU/mL   FSH 4.0 mIU/mL  CBC with Differential/Platelet   Collection Time: 09/10/23  8:52 AM  Result Value Ref Range   WBC 11.2 (H) 3.4 - 10.8 x10E3/uL   RBC 4.56 3.77 - 5.28 x10E6/uL   Hemoglobin 13.6 11.1 - 15.9 g/dL   Hematocrit 57.5 65.9 - 46.6 %   MCV 93 79 - 97 fL   MCH 29.8 26.6 - 33.0 pg   MCHC 32.1 31.5 -  35.7 g/dL   RDW 86.8 88.2 - 84.5 %   Platelets 372 150 - 450 x10E3/uL   Neutrophils 63 Not Estab. %   Lymphs 27 Not Estab. %   Monocytes 6 Not Estab. %   Eos 2 Not Estab. %   Basos 1 Not Estab. %   Neutrophils Absolute 7.1 (H) 1.4 - 7.0 x10E3/uL   Lymphocytes Absolute 3.0 0.7 - 3.1 x10E3/uL   Monocytes Absolute 0.7 0.1 - 0.9 x10E3/uL   EOS (ABSOLUTE) 0.2 0.0 - 0.4 x10E3/uL   Basophils Absolute 0.1 0.0 - 0.2 x10E3/uL   Immature Granulocytes 1 Not Estab. %   Immature Grans (Abs) 0.1 0.0 - 0.1 x10E3/uL      Assessment & Plan:   Problem List Items Addressed This Visit       Cardiovascular and Mediastinum   Hypertension     Other   Mixed hyperlipidemia   Morbid obesity (HCC)   Prediabetes - Primary     Follow up plan: No follow-ups on file.

## 2024-07-18 ENCOUNTER — Encounter: Payer: Self-pay | Admitting: Nurse Practitioner

## 2024-07-18 ENCOUNTER — Ambulatory Visit (INDEPENDENT_AMBULATORY_CARE_PROVIDER_SITE_OTHER): Admitting: Nurse Practitioner

## 2024-07-18 VITALS — BP 130/78 | HR 84 | Temp 98.3°F | Resp 16 | Ht 66.0 in | Wt 255.4 lb

## 2024-07-18 DIAGNOSIS — G5793 Unspecified mononeuropathy of bilateral lower limbs: Secondary | ICD-10-CM

## 2024-07-18 DIAGNOSIS — E782 Mixed hyperlipidemia: Secondary | ICD-10-CM

## 2024-07-18 DIAGNOSIS — I10 Essential (primary) hypertension: Secondary | ICD-10-CM | POA: Diagnosis not present

## 2024-07-18 DIAGNOSIS — R7303 Prediabetes: Secondary | ICD-10-CM

## 2024-07-18 DIAGNOSIS — R200 Anesthesia of skin: Secondary | ICD-10-CM

## 2024-07-18 MED ORDER — GABAPENTIN 400 MG PO CAPS: 400.0000 mg | ORAL_CAPSULE | Freq: Every day | ORAL | 3 refills | Status: DC

## 2024-07-18 MED ORDER — ZEPBOUND 5 MG/0.5ML ~~LOC~~ SOAJ
5.0000 mg | SUBCUTANEOUS | 5 refills | Status: DC
Start: 2024-07-18 — End: 2024-07-18

## 2024-07-18 MED ORDER — ZEPBOUND 5 MG/0.5ML ~~LOC~~ SOAJ: 5.0000 mg | SUBCUTANEOUS | 5 refills | Status: DC

## 2024-07-18 NOTE — Progress Notes (Signed)
 [facility] [address] [place], [place] [address]  Internal MEDICINE  Office Visit Note  Patient Name: Yvonne Davis  [id number]  [id number]  Date of Service: 07/18/2024  Chief Complaint  Patient presents with   Hypertension   Follow-up    HPI [name] presents for a follow-up visit for hypertension, high cholesterol and weight loss.  Weight loss -- has been on phentermine  for 2 months with no significant weight loss and she has been experiencing severe dry mouth and headaches while on the phentermine . She has been on a reduced calorie, low carb low fat diet for at least 6 months or more. She has been exercising by walking as much as she physically can for 6 months or more. She does have some neuropathy in the left foot which has limited the amount of exercise she is able to do.  Hypertension -- controlled with currrent medications High cholesterol -- taking pravastatin   The 10-year ASCVD risk score (Arnett DK, et al., 2019) is: 4.6%   Values used to calculate the score:     Age: 43 years     Clincally relevant sex: Female     Is Non-Hispanic African American: No     Diabetic: No     Tobacco smoker: No     Systolic Blood Pressure: 130 mmHg     Is BP treated: Yes     HDL Cholesterol: 29 mg/dL     Total Cholesterol: 253 mg/dL      Current Medication: Outpatient Encounter Medications as of 07/18/2024  Medication Sig   gabapentin  (NEURONTIN ) 400 MG capsule Take 1 capsule (400 mg total) by mouth at bedtime.   [DISCONTINUED] tirzepatide  (ZEPBOUND ) 5 MG/0.5ML Pen Inject 5 mg into the skin once a week.   cyclobenzaprine  (FLEXERIL ) 10 MG tablet TAKE 1 TABLET(10 MG) BY MOUTH AT BEDTIME AS NEEDED FOR MUSCLE SPASMS   pravastatin  (PRAVACHOL ) 10 MG tablet Take 1 tablet (10 mg total) by mouth daily.   telmisartan -hydrochlorothiazide  (MICARDIS  HCT) 40-12.5 MG tablet TAKE 1 TABLET BY MOUTH DAILY   tirzepatide  (ZEPBOUND ) 5 MG/0.5ML Pen Inject 5 mg into the skin once a week.    Vitamin D , Ergocalciferol , (DRISDOL ) 1.25 MG (50000 UNIT) CAPS capsule TAKE 1 CAPSULE BY MOUTH EVERY 7 DAYS   [DISCONTINUED] gabapentin  (NEURONTIN ) 100 MG capsule Take 1 capsule (100 mg total) by mouth daily with breakfast.   [DISCONTINUED] gabapentin  (NEURONTIN ) 300 MG capsule Take 1 capsule (300 mg total) by mouth at bedtime.   [DISCONTINUED] phentermine  (ADIPEX-P ) 37.5 MG tablet Take 1 tablet (37.5 mg total) by mouth daily before breakfast.   No facility-administered encounter medications on file as of 07/18/2024.    Surgical History: Past Surgical History:  Procedure Laterality Date   ABDOMINAL HYSTERECTOMY     BREAST BIOPSY Left 03/07/2019   left bc lymp node us  bx  LYMPH NODE NEGATIVE FOR MORPHOLOGIC FEATURES OF A NEOPLASTIC PROCESS.   BREAST BIOPSY Left 04/27/2023   Stereo bx, Coil Clip, Path pending   BREAST BIOPSY Left 04/27/2023   MM LT BREAST BX W LOC DEV 1ST LESION IMAGE BX SPEC STEREO GUIDE 04/27/2023 [facility]    Medical History: Past Medical History:  Diagnosis Date   Heart palpitations    Hypertension     Family History: Family History  Problem Relation Age of Onset   Cancer Father    Breast cancer Maternal Uncle 19   Breast cancer Maternal Grandmother 55    Social History   Socioeconomic History  Marital status: Single    Spouse name: Not on file   Number of children: Not on file   Years of education: Not on file   Highest education level: Not on file  Occupational History   Not on file  Tobacco Use   Smoking status: Former    Current packs/day: 1.00    Types: Cigarettes   Smokeless tobacco: Never   Tobacco comments:    Quit 2 weeks ago.   Substance and Sexual Activity   Alcohol use: No   Drug use: Never   Sexual activity: Yes  Other Topics Concern   Not on file  Social History Narrative   Not on file   Social Drivers of Health   Financial Resource Strain: Medium Risk (02/21/2024)   [facility] from [facility]    Overall Financial Resource Strain (CARDIA)    Difficulty of Paying Living Expenses: Somewhat hard  Food Insecurity: No Food Insecurity (02/21/2024)   Received from [facility]   Hunger Vital Sign    Within the past 12 months, you worried that your food would run out before you got the money to buy more.: Never true    Within the past 12 months, the food you bought just didn't last and you didn't have money to get more.: Never true  Transportation Needs: No Transportation Needs (02/21/2024)   Received from [facility] - Transportation    In the past 12 months, has lack of transportation kept you from medical appointments or from getting medications?: No    Lack of Transportation (Non-Medical): No  Physical Activity: Not on file  Stress: Not on file  Social Connections: Not on file  Intimate Partner Violence: Not on file      Review of Systems  Constitutional:  Positive for fatigue and unexpected weight change. Negative for chills.  HENT:  Negative for congestion, postnasal drip, rhinorrhea, sneezing and sore throat.   Eyes:  Negative for redness.  Respiratory: Negative.  Negative for cough, chest tightness, shortness of breath and wheezing.   Cardiovascular: Negative.  Negative for chest pain and palpitations.  Gastrointestinal: Negative.  Negative for abdominal pain, constipation, diarrhea, nausea and vomiting.  Endocrine: Negative for polyphagia.  Genitourinary:  Negative for dysuria and frequency.  Musculoskeletal:  Positive for arthralgias (right elbow, right hand), back pain and gait problem. Negative for joint swelling and neck pain.       Feet hurt  Skin:  Negative for rash.  Neurological:  Positive for dizziness. Negative for tremors and numbness.  Psychiatric/Behavioral:  Negative for behavioral problems (Depression), sleep disturbance and suicidal ideas. The patient is not nervous/anxious.     Vital Signs: BP 130/78   Pulse 84    Temp 98.3 F (36.8 C)   Resp 16   Ht 5' 6 (1.676 m)   Wt 255 lb 6.4 oz (115.8 kg)   SpO2 97%   BMI 41.22 kg/m    Physical Exam Vitals reviewed.  Constitutional:      General: She is not in acute distress.    Appearance: Normal appearance. She is obese. She is not ill-appearing.  HENT:     Head: Normocephalic and atraumatic.  Eyes:     Pupils: Pupils are equal, round, and reactive to light.  Cardiovascular:     Rate and Rhythm: Normal rate and regular rhythm.  Pulmonary:     Effort: Pulmonary effort is normal. No respiratory distress.  Skin:  Capillary Refill: Capillary refill takes less than 2 seconds.  Neurological:     Mental Status: She is alert and oriented to person, place, and time.  Psychiatric:        Mood and Affect: Mood normal.        Behavior: Behavior normal.        Assessment/Plan: 1. Prediabetes (Primary) Weight los will help A1c improve.   2. Primary hypertension Continue micardis  as prescribed   3. Mixed hyperlipidemia Continue pravastatin  as prescribed.   4. Neuropathy of both feet Gabapentin  dose increased  - gabapentin  (NEURONTIN ) 400 MG capsule; Take 1 capsule (400 mg total) by mouth at bedtime.  Dispense: 30 capsule; Refill: 3  5. Bilateral leg numbness Noted and most likely related to neuropathy  6. Morbid obesity (HCC) Retry zepbound  prior authorization - tirzepatide  (ZEPBOUND ) 5 MG/0.5ML Pen; Inject 5 mg into the skin once a week.  Dispense: 2 mL; Refill: 5   General Counseling: Christana verbalizes understanding of the findings of todays visit and agrees with plan of treatment. I have discussed any further diagnostic evaluation that may be needed or ordered today. We also reviewed her medications today. she has been encouraged to call the office with any questions or concerns that should arise related to todays visit.    No orders of the defined types were placed in this encounter.   Meds ordered this encounter  Medications    DISCONTD: tirzepatide  (ZEPBOUND ) 5 MG/0.5ML Pen    Sig: Inject 5 mg into the skin once a week.    Dispense:  2 mL    Refill:  5   tirzepatide  (ZEPBOUND ) 5 MG/0.5ML Pen    Sig: Inject 5 mg into the skin once a week.    Dispense:  2 mL    Refill:  5    Discontinue phentermine  and fill new script, please send prior auth request asap.   gabapentin  (NEURONTIN ) 400 MG capsule    Sig: Take 1 capsule (400 mg total) by mouth at bedtime.    Dispense:  30 capsule    Refill:  3    Fill new script today, note increased dose, discontinue 300 mg and 100 mg doses.    Return in about 3 months (around 10/18/2024) for F/U, eval new med, Weight loss, Goebel Hellums PCP.   Total time spent:30 Minutes Time spent includes review of chart, medications, test results, and follow up plan with the patient.   [place] Controlled Substance Database was reviewed by me.  This patient was seen by Mardy Maxin, FNP-C in collaboration with Dr. Sigrid Bathe as a part of collaborative care agreement.   Carlester Kasparek R. Maxin, MSN, FNP-C Internal medicine

## 2024-08-14 ENCOUNTER — Telehealth: Payer: Self-pay

## 2024-08-14 NOTE — Telephone Encounter (Signed)
 Pt advised as per we don't have zepbound  as per alyssa we have Mounjaro  basically its same

## 2024-09-21 ENCOUNTER — Other Ambulatory Visit: Payer: Self-pay | Admitting: Family Medicine

## 2024-09-21 ENCOUNTER — Inpatient Hospital Stay
Admission: RE | Admit: 2024-09-21 | Discharge: 2024-09-21 | Disposition: A | Discharge status: Home / Self Care | Payer: Self-pay | Source: Ambulatory Visit | Attending: Physician Assistant | Admitting: Physician Assistant

## 2024-09-21 DIAGNOSIS — Z049 Encounter for examination and observation for unspecified reason: Secondary | ICD-10-CM

## 2024-09-22 NOTE — Progress Notes (Signed)
 Referring Physician:  Liana Fish, NP [address] [place],  [place] [address]  Primary Physician:  Liana Fish, NP  History of Present Illness: 09/27/2024 [name] is here today with a chief complaint of neuropathic pain in bilateral feet that has become progressively worse over the past several years.  She states that it started in her toes and has slowly creeped up to the level of her ankle.  Her left foot is worse than her right.  She states that it is a constant burning pain and because her numbness is so bad in her feet she feels as though her balance is off.  She does have some mild low back pain on the right side but it does not radiate down her leg.      Weakness: none  Bowel/Bladder Dysfunction: none  Conservative measures:  Physical therapy: Has not participated in recently. Multimodal medical therapy including regular antiinflammatories: gabapentin , cyclobenzaprine ,  Injections: No epidural steroid injections.    Yvonne Davis has no symptoms of cervical myelopathy.  The symptoms are causing a significant impact on the patient's life.   Review of Systems:  A 10 point review of systems is negative, except for the pertinent positives and negatives detailed in the HPI.  Past Medical History: Past Medical History:  Diagnosis Date   Heart palpitations    Hypertension     Past Surgical History: Past Surgical History:  Procedure Laterality Date   ABDOMINAL HYSTERECTOMY     BREAST BIOPSY Left 03/07/2019   left bc lymp node us  bx  LYMPH NODE NEGATIVE FOR MORPHOLOGIC FEATURES OF A NEOPLASTIC PROCESS.   BREAST BIOPSY Left 04/27/2023   Stereo bx, Coil Clip, Path pending   BREAST BIOPSY Left 04/27/2023   MM LT BREAST BX W LOC DEV 1ST LESION IMAGE BX SPEC STEREO GUIDE 04/27/2023 [facility]    Allergies: Allergies as of 09/27/2024 - Review Complete 09/27/2024  Allergen Reaction Noted   Ace inhibitors Swelling 01/16/2016     Medications: Outpatient Encounter Medications as of 09/27/2024  Medication Sig   cyclobenzaprine  (FLEXERIL ) 10 MG tablet TAKE 1 TABLET(10 MG) BY MOUTH AT BEDTIME AS NEEDED FOR MUSCLE SPASMS   gabapentin  (NEURONTIN ) 400 MG capsule Take 1 capsule (400 mg total) by mouth at bedtime.   pravastatin  (PRAVACHOL ) 10 MG tablet Take 1 tablet (10 mg total) by mouth daily.   telmisartan -hydrochlorothiazide  (MICARDIS  HCT) 40-12.5 MG tablet TAKE 1 TABLET BY MOUTH DAILY   Vitamin D , Ergocalciferol , (DRISDOL ) 1.25 MG (50000 UNIT) CAPS capsule TAKE 1 CAPSULE BY MOUTH EVERY 7 DAYS   tirzepatide  (ZEPBOUND ) 5 MG/0.5ML Pen Inject 5 mg into the skin once a week. (Patient not taking: Reported on 09/27/2024)   No facility-administered encounter medications on file as of 09/27/2024.    Social History: Social History   Tobacco Use   Smoking status: Former    Current packs/day: 1.00    Types: Cigarettes   Smokeless tobacco: Never   Tobacco comments:    Quit 2 weeks ago.   Substance Use Topics   Alcohol use: No   Drug use: Never    Family Medical History: Family History  Problem Relation Age of Onset   Cancer Father    Breast cancer Maternal Uncle 52   Breast cancer Maternal Grandmother 47    Physical Examination: @VITALWITHPAIN @  General: Patient is well developed, well nourished, calm, collected, and in no apparent distress. Attention to examination is appropriate.  Psychiatric: Patient is non-anxious.  Head:  Pupils equal,  round, and reactive to light.  ENT:  Oral mucosa appears well hydrated.  Neck:   Supple.  Full range of motion.  Respiratory: Patient is breathing without any difficulty.  Extremities: No edema.  Vascular: Palpable dorsal pedal pulses.  Skin:   On exposed skin, there are no abnormal skin lesions.  NEUROLOGICAL:     Awake, alert, oriented to person, place, and time.  Speech is clear and fluent. Fund of knowledge is appropriate.   Cranial Nerves: Pupils equal  round and reactive to light.  Facial tone is symmetric.   ROM of spine: Patient has some minimal tenderness in her right lumbar paraspinals.  Strength:  Side Iliopsoas Quads Hamstring PF DF EHL  R 5 5 5 5 5 5   L 5 5 5 5 5 5    Patient has decreased sensation in bilateral feet.  Reflexes are 1+ in bilateral patella, absent Achilles. Patient's gait affected secondary to numbness.  No ankle clonus.  Medical Decision Making  Imaging: No recent spine imaging  I have personally reviewed the images and agree with the above interpretation.  Assessment and Plan: [name] is a pleasant 43 y.o. 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  I do think low threshold for differential to include pain coming from lumbar spine.  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  Plan includes the following:  - Labs to be completed today, B12, folate, A1c - Continue gabapentin  at night for pain - EMG of bilateral lower extremities - Referral to pain for possible spinal cord stimulator due to her neuropathic pain.    Thank you for involving me in the care of this patient.     Lyle Decamp, PA-C Dept. of Neurosurgery

## 2024-09-27 ENCOUNTER — Encounter: Payer: Self-pay | Admitting: Physician Assistant

## 2024-09-27 ENCOUNTER — Ambulatory Visit (INDEPENDENT_AMBULATORY_CARE_PROVIDER_SITE_OTHER): Admitting: Physician Assistant

## 2024-09-27 VITALS — BP 124/80 | Ht 66.0 in | Wt 258.8 lb

## 2024-09-27 DIAGNOSIS — G5793 Unspecified mononeuropathy of bilateral lower limbs: Secondary | ICD-10-CM | POA: Diagnosis not present

## 2024-09-28 ENCOUNTER — Other Ambulatory Visit: Payer: Self-pay | Admitting: Physician Assistant

## 2024-09-28 ENCOUNTER — Encounter: Payer: Self-pay | Admitting: Neurology

## 2024-09-28 DIAGNOSIS — G5793 Unspecified mononeuropathy of bilateral lower limbs: Secondary | ICD-10-CM | POA: Diagnosis not present

## 2024-09-29 ENCOUNTER — Ambulatory Visit: Payer: Self-pay | Admitting: Physician Assistant

## 2024-09-29 LAB — HEMOGLOBIN A1C
Est. average glucose Bld gHb Est-mCnc: 126 mg/dL
Hgb A1c MFr Bld: 6 % — ABNORMAL HIGH (ref 4.8–5.6)

## 2024-09-29 LAB — B12 AND FOLATE PANEL
Folate: 8 ng/mL (ref >3.0)
Vitamin B-12: 312 pg/mL (ref 232–1245)

## 2024-10-09 ENCOUNTER — Other Ambulatory Visit: Payer: Self-pay | Admitting: Nurse Practitioner

## 2024-10-18 ENCOUNTER — Ambulatory Visit: Admitting: Nurse Practitioner

## 2024-10-18 ENCOUNTER — Other Ambulatory Visit: Payer: Self-pay

## 2024-10-18 DIAGNOSIS — R202 Paresthesia of skin: Secondary | ICD-10-CM

## 2024-11-19 DIAGNOSIS — G894 Chronic pain syndrome: Secondary | ICD-10-CM | POA: Insufficient documentation

## 2024-11-19 DIAGNOSIS — Z79899 Other long term (current) drug therapy: Secondary | ICD-10-CM | POA: Insufficient documentation

## 2024-11-19 DIAGNOSIS — Z789 Other specified health status: Secondary | ICD-10-CM | POA: Insufficient documentation

## 2024-11-19 DIAGNOSIS — M899 Disorder of bone, unspecified: Secondary | ICD-10-CM | POA: Insufficient documentation

## 2024-11-19 NOTE — Progress Notes (Unsigned)
 PROVIDER NOTE: Interpretation of information contained herein should be left to medically-trained personnel. Specific patient instructions are provided elsewhere under Patient Instructions section of medical record. This document was created in part using AI and STT-dictation technology, any transcriptional errors that may result from this process are unintentional.  Patient: Jon JONELLE Register  Service: E/M Encounter  Provider: Eric DELENA Como, MD  DOB: 1981-08-15  Delivery: Face-to-face  Specialty: Interventional Pain Management  MRN: [id number]  Setting: Ambulatory outpatient facility  Specialty designation: 09  Type: New Patient  Location: Outpatient office facility  PCP: Liana Fish, NP  DOS: 11/20/2024    Referring Prov.: Ulis Bottcher, PA-C   Primary Reason(s) for Visit: Encounter for initial evaluation of one or more chronic problems (new to examiner) potentially causing chronic pain, and posing a threat to normal musculoskeletal function. (Level of risk: High) CC: No chief complaint on file.  HPI  Ms. Cochrane is a 43 y.o. year old, female patient, who comes for the first time to our practice referred by Ulis Bottcher, PA-C for our initial evaluation of her chronic pain. She has Essential hypertension; Neuropathy of both feet; Bilateral leg numbness; Mixed hyperlipidemia; Morbid obesity (HCC); Prediabetes; Heart palpitations; Chronic pain syndrome; Pharmacologic therapy; Disorder of skeletal system; and Problems influencing health status on their problem list. Today she comes in for evaluation of her No chief complaint on file.  Pain Assessment: Location:     Radiating:   Onset:   Duration:   Quality:   Severity:  /10 (subjective, self-reported pain score)  Effect on ADL:   Timing:   Modifying factors:   BP:    HR:    Onset and Duration: {Hx; Onset and Duration:210120511} Cause of pain: {Hx; Cause:210120521} Severity: {Pain Severity:210120502} Timing: {Symptoms;  Timing:210120501} Aggravating Factors: {Causes; Aggravating pain factors:210120507} Alleviating Factors: {Causes; Alleviating Factors:210120500} Associated Problems: {Hx; Associated problems:210120515} Quality of Pain: {Hx; Symptom quality or Descriptor:210120531} Previous Examinations or Tests: {Hx; Previous examinations or test:210120529} Previous Treatments: {Hx; Previous Treatment:210120503}  Ms. Hunnell is being evaluated for possible interventional pain management therapies for the treatment of her chronic pain.  Discussed the use of AI scribe software for clinical note transcription with the patient, who gave verbal consent to proceed.  History of Present Illness            Ms. Labrecque has been informed that this initial visit was an evaluation only.  On the follow up appointment I will go over the results, including ordered tests and available interventional therapies. At that time she will have the opportunity to decide whether to proceed with offered therapies or not. In the event that Ms. Moultry prefers avoiding interventional options, this will conclude our involvement in the case.  Medication management recommendations may be provided upon request.  Patient informed that diagnostic tests may be ordered to assist in identifying underlying causes, narrow the list of differential diagnoses and aid in determining candidacy for (or contraindications to) planned therapeutic interventions.  Historic Controlled Substance Pharmacotherapy Review PMP and historical list of controlled substances: Gabapentin  400 Mg Capsule   Most recently prescribed controlled substance(s): Opioid Analgesic: None MME/day: 0 mg/day  Historical Monitoring: The patient  reports no history of drug use. List of prior UDS Testing: No results found for: MDMA, COCAINSCRNUR, PCPSCRNUR, PCPQUANT, CANNABQUANT, THCU, ETH, CBDTHCR, D8THCCBX, D9THCCBX Historical Background Evaluation: [place] PMP:  PDMP reviewed during this encounter. Review of the past 50-months conducted.             PMP NARX  Score Report:  Narcotic: 000 Sedative: 000 Stimulant: 090 [place] Department of public safety, offender search: Engineer, [employer] Information) Non-contributory Risk Assessment Profile: Aberrant behavior: None observed or detected today Risk factors for fatal opioid overdose: None identified today PMP NARX Overdose Risk Score: 260 Fatal overdose hazard ratio (HR): Calculation deferred Non-fatal overdose hazard ratio (HR): Calculation deferred Risk of opioid abuse or dependence: 0.7-3.0% with doses <= 36 MME/day and 6.1-26% with doses >= 120 MME/day. Substance use disorder (SUD) risk level: See below Personal History of Substance Abuse (SUD-Substance use disorder):  Alcohol:    Illegal Drugs:    Rx Drugs:    ORT Risk Level calculation:    ORT Scoring interpretation table:  Score <3 = Low Risk for SUD  Score between 4-7 = Moderate Risk for SUD  Score >8 = High Risk for Opioid Abuse   PHQ-2 Depression Scale:  Total score:    PHQ-2 Scoring interpretation table: (Score and probability of major depressive disorder)  Score 0 = No depression  Score 1 = 15.4% Probability  Score 2 = 21.1% Probability  Score 3 = 38.4% Probability  Score 4 = 45.5% Probability  Score 5 = 56.4% Probability  Score 6 = 78.6% Probability   PHQ-9 Depression Scale:  Total score:    PHQ-9 Scoring interpretation table:  Score 0-4 = No depression  Score 5-9 = Mild depression  Score 10-14 = Moderate depression  Score 15-19 = Moderately severe depression  Score 20-27 = Severe depression (2.4 times higher risk of SUD and 2.89 times higher risk of overuse)   Pharmacologic Plan: As per protocol, I have not taken over any controlled substance management, pending the results of ordered tests and/or consults.            Initial impression: Pending review of available data and ordered tests.  Meds   Current Outpatient Medications:     cyclobenzaprine  (FLEXERIL ) 10 MG tablet, TAKE 1 TABLET(10 MG) BY MOUTH AT BEDTIME AS NEEDED FOR MUSCLE SPASMS, Disp: 90 tablet, Rfl: 1   gabapentin  (NEURONTIN ) 400 MG capsule, Take 1 capsule (400 mg total) by mouth at bedtime., Disp: 30 capsule, Rfl: 3   pravastatin  (PRAVACHOL ) 10 MG tablet, Take 1 tablet (10 mg total) by mouth daily., Disp: 30 tablet, Rfl: 3   telmisartan -hydrochlorothiazide  (MICARDIS  HCT) 40-12.5 MG tablet, TAKE 1 TABLET BY MOUTH DAILY, Disp: 30 tablet, Rfl: 5   tirzepatide  (ZEPBOUND ) 5 MG/0.5ML Pen, Inject 5 mg into the skin once a week. (Patient not taking: Reported on 09/27/2024), Disp: 2 mL, Rfl: 5   Vitamin D , Ergocalciferol , (DRISDOL ) 1.25 MG (50000 UNIT) CAPS capsule, TAKE 1 CAPSULE BY MOUTH EVERY 7 DAYS, Disp: 12 capsule, Rfl: 1  Imaging Review   Complexity Note: No results found under the [facility] electronic medical record.                         ROS  Cardiovascular: {Hx; Cardiovascular History:210120525} Pulmonary or Respiratory: {Hx; Pumonary and/or Respiratory History:210120523} Neurological: {Hx; Neurological:210120504} Psychological-Psychiatric: {Hx; Psychological-Psychiatric History:210120512} Gastrointestinal: {Hx; Gastrointestinal:210120527} Genitourinary: {Hx; Genitourinary:210120506} Hematological: {Hx; Hematological:210120510} Endocrine: {Hx; Endocrine history:210120509} Rheumatologic: {Hx; Rheumatological:210120530} Musculoskeletal: {Hx; Musculoskeletal:210120528} Work History: {Hx; Work history:210120514}  Allergies  Ms. Mccart is allergic to ace inhibitors.  Laboratory Chemistry Profile   Renal Lab Results  Component Value Date   BUN 9 04/28/2023   CREATININE 0.79 04/28/2023   BCR 11 04/28/2023   GFRAA >60 01/16/2016   GFRNONAA >60 01/16/2016   SPECGRAV 1.016 05/05/2023  PHUR 6.0 05/05/2023   PROTEINUR Negative 05/05/2023     Electrolytes Lab Results  Component Value Date   NA 135 04/28/2023   K 4.5 04/28/2023   CL 98  04/28/2023   CALCIUM  9.3 04/28/2023     Hepatic Lab Results  Component Value Date   AST 12 04/28/2023   ALT 16 04/28/2023   ALBUMIN 4.3 04/28/2023   ALKPHOS 78 04/28/2023     ID No results found for: LYMEIGGIGMAB, HIV, SARSCOV2NAA, STAPHAUREUS, MRSAPCR, HCVAB, PREGTESTUR, RMSFIGG, QFVRPH1IGG, QFVRPH2IGG   Bone Lab Results  Component Value Date   VD25OH 15.5 (L) 04/28/2023     Endocrine Lab Results  Component Value Date   GLUCOSE 104 (H) 04/28/2023   GLUCOSEU Negative 05/05/2023   HGBA1C 6.0 (H) 09/28/2024   TSH 1.220 01/16/2016     Neuropathy Lab Results  Component Value Date   VITAMINB12 312 09/28/2024   FOLATE 8.0 09/28/2024   HGBA1C 6.0 (H) 09/28/2024     CNS No results found for: COLORCSF, APPEARCSF, RBCCOUNTCSF, WBCCSF, POLYSCSF, LYMPHSCSF, EOSCSF, PROTEINCSF, GLUCCSF, JCVIRUS, CSFOLI, IGGCSF, LABACHR, ACETBL   Inflammation (CRP: Acute  ESR: Chronic) No results found for: CRP, ESRSEDRATE, LATICACIDVEN   Rheumatology No results found for: RF, ANA, LABURIC, URICUR, LYMEIGGIGMAB, LYMEABIGMQN, HLAB27   Coagulation Lab Results  Component Value Date   PLT 372 09/10/2023     Cardiovascular Lab Results  Component Value Date   CKTOTAL 92 05/04/2012   CKMB < 0.5 (L) 05/04/2012   TROPONINI <0.03 01/16/2016   HGB 13.6 09/10/2023   HCT 42.4 09/10/2023     Screening No results found for: SARSCOV2NAA, COVIDSOURCE, STAPHAUREUS, MRSAPCR, HCVAB, HIV, PREGTESTUR   Cancer No results found for: CEA, CA125, LABCA2   Allergens No results found for: ALMOND, APPLE, ASPARAGUS, AVOCADO, BANANA, BARLEY, BASIL, BAYLEAF, GREENBEAN, LIMABEAN, WHITEBEAN, BEEFIGE, REDBEET, BLUEBERRY, BROCCOLI, CABBAGE, MELON, CARROT, CASEIN, CASHEWNUT, CAULIFLOWER, CELERY     Note: Lab results reviewed.  PFSH  Drug: Ms. Sergent  reports no history of drug  use. Alcohol:  reports no history of alcohol use. Tobacco:  reports that she has quit smoking. Her smoking use included cigarettes. She has never used smokeless tobacco. Medical:  has a past medical history of Heart palpitations and Hypertension. Family: family history includes Breast cancer (age of onset: 53) in her maternal uncle; Breast cancer (age of onset: 1) in her maternal grandmother; Cancer in her father.  Past Surgical History:  Procedure Laterality Date   ABDOMINAL HYSTERECTOMY     BREAST BIOPSY Left 03/07/2019   left bc lymp node us  bx  LYMPH NODE NEGATIVE FOR MORPHOLOGIC FEATURES OF A NEOPLASTIC PROCESS.   BREAST BIOPSY Left 04/27/2023   Stereo bx, Coil Clip, Path pending   BREAST BIOPSY Left 04/27/2023   MM LT BREAST BX W LOC DEV 1ST LESION IMAGE BX SPEC STEREO GUIDE 04/27/2023 [facility]   Active Ambulatory Problems    Diagnosis Date Noted   Essential hypertension 06/16/2016   Neuropathy of both feet 11/17/2023   Bilateral leg numbness 11/17/2023   Mixed hyperlipidemia 01/12/2024   Morbid obesity (HCC) 01/12/2024   Prediabetes 05/09/2024   Heart palpitations 06/16/2016   Chronic pain syndrome 11/19/2024   Pharmacologic therapy 11/19/2024   Disorder of skeletal system 11/19/2024   Problems influencing health status 11/19/2024   Resolved Ambulatory Problems    Diagnosis Date Noted   No Resolved Ambulatory Problems   Past Medical History:  Diagnosis Date   Hypertension    Constitutional Exam  General appearance:  Well nourished, well developed, and well hydrated. In no apparent acute distress There were no vitals filed for this visit. BMI Assessment: Estimated body mass index is 41.77 kg/m as calculated from the following:   Height as of 09/27/24: 5' 6 (1.676 m).   Weight as of 09/27/24: 258 lb 12.8 oz (117.4 kg).  BMI interpretation table: BMI level Category Range association with higher incidence of chronic pain  <18 kg/m2 Underweight   18.5-24.9 kg/m2  Ideal body weight   25-29.9 kg/m2 Overweight Increased incidence by 20%  30-34.9 kg/m2 Obese (Class I) Increased incidence by 68%  35-39.9 kg/m2 Severe obesity (Class II) Increased incidence by 136%  >40 kg/m2 Extreme obesity (Class III) Increased incidence by 254%   Patient's current BMI Ideal Body weight  There is no height or weight on file to calculate BMI. Patient weight not recorded   BMI Readings from Last 4 Encounters:  09/27/24 41.77 kg/m  07/18/24 41.22 kg/m  05/08/24 41.16 kg/m  04/10/24 41.58 kg/m   Wt Readings from Last 4 Encounters:  09/27/24 258 lb 12.8 oz (117.4 kg)  07/18/24 255 lb 6.4 oz (115.8 kg)  05/08/24 255 lb (115.7 kg)  04/10/24 257 lb 9.6 oz (116.8 kg)    Psych/Mental status: Alert, oriented x 3 (person, place, & time)       Eyes: PERLA Respiratory: No evidence of acute respiratory distress  Assessment  Primary Diagnosis & Pertinent Problem List: The primary encounter diagnosis was Chronic pain syndrome. Diagnoses of Pharmacologic therapy, Disorder of skeletal system, Problems influencing health status, Bilateral leg numbness, Neuropathy of both feet, and Prediabetes were also pertinent to this visit.  Visit Diagnosis (New problems to examiner): 1. Chronic pain syndrome   2. Pharmacologic therapy   3. Disorder of skeletal system   4. Problems influencing health status   5. Bilateral leg numbness   6. Neuropathy of both feet   7. Prediabetes    Plan of Care (Initial workup plan)  Note: Ms. Hutchins was reminded that as per protocol, today's visit has been an evaluation only. We have not taken over the patient's controlled substance management.  Problem-specific plan: Assessment and Plan            Lab Orders  No laboratory test(s) ordered today   Imaging Orders  No imaging studies ordered today   Referral Orders  No referral(s) requested today   Procedure Orders    No procedure(s) ordered today   Pharmacotherapy  (current): Medications ordered:  No orders of the defined types were placed in this encounter.  Medications administered during this visit: Anyra R. Campanella had no medications administered during this visit.   Analgesic Pharmacotherapy:  Opioid Analgesics: For patients currently taking or requesting to take opioid analgesics, in accordance with [place]  Medical Board Guidelines, we will assess their risks and indications for the use of these substances. After completing our evaluation, we may offer recommendations, but we no longer take patients for medication management. The prescribing physician will ultimately decide, based on his/her training and level of comfort whether to adopt any of the recommendations, including whether or not to prescribe such medicines.  Membrane stabilizer: To be determined at a later time  Muscle relaxant: To be determined at a later time  NSAID: To be determined at a later time  Other analgesic(s): To be determined at a later time   Interventional management options: Ms. Wethington was informed that there is no guarantee that she would be a candidate for interventional  therapies. The decision will be based on the results of diagnostic studies, as well as Ms. Hirota's risk profile.  Procedure(s) under consideration:  Pending results of ordered studies     Interventional Therapies  Risk Factors  Considerations  Medical Comorbidities:     Planned  Pending:      Under consideration:   Pending   Completed: (Analgesic benefit)1  None at this time   Therapeutic  Palliative (PRN) options:   None established   Completed by other providers:   None reported  1(Analgesic benefit): Expressed in percentage (%). (Local anesthetic[LA] +/- sedation  L.A.Local Anesthetic  Steroid benefit  Ongoing benefit)   Provider-requested follow-up: No follow-ups on file.  Future Appointments  Date Time Provider Department Center  11/20/2024  2:00 PM Tanya Glisson, MD [facility] None  12/05/2024  9:00 AM Leigh Venetia CROME, MD [facility] None  05/09/2025  1:20 PM Liana Fish, NP [facility] None   I discussed the assessment and treatment plan with the patient. The patient was provided an opportunity to ask questions and all were answered. The patient agreed with the plan and demonstrated an understanding of the instructions.  Patient advised to call back or seek an in-person evaluation if the symptoms or condition worsens.  Duration of encounter: *** minutes.  Total time on encounter, as per AMA guidelines included both the face-to-face and non-face-to-face time personally spent by the physician and/or other qualified health care professional(s) on the day of the encounter (includes time in activities that require the physician or other qualified health care professional and does not include time in activities normally performed by clinical staff). Physician's time may include the following activities when performed: Preparing to see the patient (e.g., pre-charting review of records, searching for previously ordered imaging, lab work, and nerve conduction tests) Review of prior analgesic pharmacotherapies. Reviewing PMP Interpreting ordered tests (e.g., lab work, imaging, nerve conduction tests) Performing post-procedure evaluations, including interpretation of diagnostic procedures Obtaining and/or reviewing separately obtained history Performing a medically appropriate examination and/or evaluation Counseling and educating the patient/family/caregiver Ordering medications, tests, or procedures Referring and communicating with other health care professionals (when not separately reported) Documenting clinical information in the electronic or other health record Independently interpreting results (not separately reported) and communicating results to the patient/ family/caregiver Care coordination (not separately reported)  Note by: Glisson DELENA Tanya, MD (TTS and AI technology used. I apologize for any typographical errors that were not detected and corrected.) Date: 11/20/2024; Time: 9:43 AM

## 2024-11-19 NOTE — Patient Instructions (Incomplete)

## 2024-11-20 ENCOUNTER — Ambulatory Visit: Attending: Pain Medicine | Admitting: Pain Medicine

## 2024-11-20 VITALS — BP 123/69 | HR 80 | Temp 97.7°F | Resp 16 | Ht 66.0 in | Wt 250.0 lb

## 2024-11-20 DIAGNOSIS — M51361 Other intervertebral disc degeneration, lumbar region with lower extremity pain only: Secondary | ICD-10-CM | POA: Diagnosis not present

## 2024-11-20 DIAGNOSIS — R200 Anesthesia of skin: Secondary | ICD-10-CM | POA: Insufficient documentation

## 2024-11-20 DIAGNOSIS — M79671 Pain in right foot: Secondary | ICD-10-CM | POA: Diagnosis not present

## 2024-11-20 DIAGNOSIS — G894 Chronic pain syndrome: Secondary | ICD-10-CM | POA: Diagnosis not present

## 2024-11-20 DIAGNOSIS — M792 Neuralgia and neuritis, unspecified: Secondary | ICD-10-CM | POA: Diagnosis not present

## 2024-11-20 DIAGNOSIS — Z01818 Encounter for other preprocedural examination: Secondary | ICD-10-CM | POA: Diagnosis not present

## 2024-11-20 DIAGNOSIS — M79672 Pain in left foot: Secondary | ICD-10-CM | POA: Insufficient documentation

## 2024-11-20 DIAGNOSIS — M51369 Other intervertebral disc degeneration, lumbar region without mention of lumbar back pain or lower extremity pain: Secondary | ICD-10-CM | POA: Insufficient documentation

## 2024-11-20 DIAGNOSIS — M899 Disorder of bone, unspecified: Secondary | ICD-10-CM | POA: Insufficient documentation

## 2024-11-20 DIAGNOSIS — Z79899 Other long term (current) drug therapy: Secondary | ICD-10-CM | POA: Insufficient documentation

## 2024-11-20 DIAGNOSIS — M5134 Other intervertebral disc degeneration, thoracic region: Secondary | ICD-10-CM | POA: Insufficient documentation

## 2024-11-20 DIAGNOSIS — G8929 Other chronic pain: Secondary | ICD-10-CM | POA: Insufficient documentation

## 2024-11-20 DIAGNOSIS — Z6841 Body Mass Index (BMI) 40.0 and over, adult: Secondary | ICD-10-CM

## 2024-11-20 DIAGNOSIS — R7309 Other abnormal glucose: Secondary | ICD-10-CM | POA: Insufficient documentation

## 2024-11-20 DIAGNOSIS — E559 Vitamin D deficiency, unspecified: Secondary | ICD-10-CM | POA: Diagnosis not present

## 2024-11-20 DIAGNOSIS — Z789 Other specified health status: Secondary | ICD-10-CM | POA: Insufficient documentation

## 2024-11-20 DIAGNOSIS — R7303 Prediabetes: Secondary | ICD-10-CM | POA: Diagnosis not present

## 2024-11-20 DIAGNOSIS — G5793 Unspecified mononeuropathy of bilateral lower limbs: Secondary | ICD-10-CM | POA: Insufficient documentation

## 2024-11-20 NOTE — Progress Notes (Unsigned)
 Safety precautions to be maintained throughout the outpatient stay will include: orient to surroundings, keep bed in low position, maintain call bell within reach at all times, provide assistance with transfer out of bed and ambulation.

## 2024-11-21 ENCOUNTER — Ambulatory Visit
Admission: RE | Admit: 2024-11-21 | Discharge: 2024-11-21 | Disposition: A | Source: Ambulatory Visit | Attending: Pain Medicine | Admitting: Pain Medicine

## 2024-11-21 ENCOUNTER — Other Ambulatory Visit: Payer: Self-pay | Admitting: Nurse Practitioner

## 2024-11-21 DIAGNOSIS — G8929 Other chronic pain: Secondary | ICD-10-CM

## 2024-11-21 DIAGNOSIS — M79672 Pain in left foot: Secondary | ICD-10-CM | POA: Diagnosis not present

## 2024-11-21 DIAGNOSIS — G5793 Unspecified mononeuropathy of bilateral lower limbs: Secondary | ICD-10-CM

## 2024-11-21 DIAGNOSIS — R200 Anesthesia of skin: Secondary | ICD-10-CM | POA: Diagnosis not present

## 2024-11-21 DIAGNOSIS — I10 Essential (primary) hypertension: Secondary | ICD-10-CM

## 2024-11-21 DIAGNOSIS — M79671 Pain in right foot: Secondary | ICD-10-CM | POA: Diagnosis not present

## 2024-11-24 LAB — COMPLIANCE DRUG ANALYSIS, UR

## 2024-11-27 DIAGNOSIS — F4542 Pain disorder with related psychological factors: Secondary | ICD-10-CM | POA: Diagnosis not present

## 2024-11-27 LAB — COMP. METABOLIC PANEL (12)
AST: 12 IU/L (ref 0–40)
Albumin: 4.2 g/dL (ref 3.9–4.9)
Alkaline Phosphatase: 57 IU/L (ref 41–116)
BUN/Creatinine Ratio: 12 (ref 9–23)
BUN: 10 mg/dL (ref 6–24)
Bilirubin Total: 0.2 mg/dL (ref 0.0–1.2)
Calcium: 9 mg/dL (ref 8.7–10.2)
Chloride: 105 mmol/L (ref 96–106)
Creatinine, Ser: 0.81 mg/dL (ref 0.57–1.00)
Globulin, Total: 2.5 g/dL (ref 1.5–4.5)
Glucose: 70 mg/dL (ref 70–99)
Potassium: 4.5 mmol/L (ref 3.5–5.2)
Sodium: 141 mmol/L (ref 134–144)
Total Protein: 6.7 g/dL (ref 6.0–8.5)
eGFR: 91 mL/min/1.73 (ref >59)

## 2024-11-27 LAB — VITAMIN B1: Thiamine: 120.5 nmol/L (ref 66.5–200.0)

## 2024-11-27 LAB — 25-HYDROXY VITAMIN D LCMS D2+D3
25-Hydroxy, Vitamin D-2: <1 ng/mL
25-Hydroxy, Vitamin D-3: 26 ng/mL
25-Hydroxy, Vitamin D: 27 ng/mL — AB

## 2024-11-27 LAB — VITAMIN B12: Vitamin B-12: 374 pg/mL (ref 232–1245)

## 2024-11-27 LAB — VITAMIN E
Vitamin E (Alpha Tocopherol): 13.2 mg/L (ref 7.0–25.1)
Vitamin E(Gamma Tocopherol): 3.7 mg/L (ref 0.5–5.5)

## 2024-11-27 LAB — MAGNESIUM: Magnesium: 2.3 mg/dL (ref 1.6–2.3)

## 2024-11-27 LAB — C-REACTIVE PROTEIN: CRP: 29 mg/L — ABNORMAL HIGH (ref 0–10)

## 2024-11-27 LAB — SEDIMENTATION RATE: Sed Rate: 81 mm/h — ABNORMAL HIGH (ref 0–32)

## 2024-11-28 ENCOUNTER — Ambulatory Visit

## 2024-11-28 ENCOUNTER — Ambulatory Visit: Admission: RE | Admit: 2024-11-28 | Source: Ambulatory Visit

## 2024-12-05 ENCOUNTER — Encounter: Admitting: Neurology

## 2024-12-06 ENCOUNTER — Other Ambulatory Visit: Payer: Self-pay | Admitting: Nurse Practitioner

## 2024-12-06 DIAGNOSIS — G5793 Unspecified mononeuropathy of bilateral lower limbs: Secondary | ICD-10-CM

## 2024-12-18 ENCOUNTER — Ambulatory Visit

## 2025-02-19 ENCOUNTER — Encounter: Admitting: Neurology

## 2025-05-09 ENCOUNTER — Encounter: Admitting: Nurse Practitioner
